# Patient Record
Sex: Male | Born: 2000 | Race: Black or African American | Hispanic: No | Marital: Single | Smoking: Never smoker
Health system: Southern US, Community
[De-identification: ages and names within clinical notes are randomized; demographics above are authoritative.]

## PROBLEM LIST (undated history)

## (undated) DIAGNOSIS — F32A Depression, unspecified: Secondary | ICD-10-CM

## (undated) DIAGNOSIS — F419 Anxiety disorder, unspecified: Secondary | ICD-10-CM

## (undated) DIAGNOSIS — J45909 Unspecified asthma, uncomplicated: Secondary | ICD-10-CM

## (undated) DIAGNOSIS — B009 Herpesviral infection, unspecified: Secondary | ICD-10-CM

## (undated) HISTORY — DX: Unspecified asthma, uncomplicated: J45.909

## (undated) HISTORY — DX: Depression, unspecified: F32.A

## (undated) HISTORY — DX: Anxiety disorder, unspecified: F41.9

---
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
## 2018-07-03 ENCOUNTER — Encounter: Payer: Self-pay | Admitting: Emergency Medicine

## 2018-07-03 ENCOUNTER — Other Ambulatory Visit: Payer: Self-pay

## 2018-07-03 DIAGNOSIS — J029 Acute pharyngitis, unspecified: Secondary | ICD-10-CM | POA: Diagnosis present

## 2018-07-03 DIAGNOSIS — R509 Fever, unspecified: Secondary | ICD-10-CM | POA: Insufficient documentation

## 2018-07-03 DIAGNOSIS — J020 Streptococcal pharyngitis: Secondary | ICD-10-CM | POA: Diagnosis not present

## 2018-07-03 DIAGNOSIS — Z5321 Procedure and treatment not carried out due to patient leaving prior to being seen by health care provider: Secondary | ICD-10-CM | POA: Insufficient documentation

## 2018-07-03 NOTE — ED Triage Notes (Signed)
Pt works for [employer] and was instructed to come to ED due to pt having sore throat and fever since Wednesday. Pt denies cough.

## 2018-07-04 ENCOUNTER — Emergency Department
Admission: EM | Admit: 2018-07-04 | Discharge: 2018-07-04 | Disposition: A | Discharge status: Home / Self Care | Payer: Medicaid Other | Attending: Emergency Medicine | Admitting: Emergency Medicine

## 2018-07-04 ENCOUNTER — Emergency Department
Admission: EM | Admit: 2018-07-04 | Discharge: 2018-07-04 | Disposition: A | Discharge status: Home / Self Care | Payer: Medicaid Other | Source: Home / Self Care | Attending: Emergency Medicine | Admitting: Emergency Medicine

## 2018-07-04 ENCOUNTER — Other Ambulatory Visit: Payer: Self-pay

## 2018-07-04 ENCOUNTER — Emergency Department: Payer: Medicaid Other

## 2018-07-04 ENCOUNTER — Encounter: Payer: Self-pay | Admitting: Emergency Medicine

## 2018-07-04 DIAGNOSIS — J020 Streptococcal pharyngitis: Secondary | ICD-10-CM

## 2018-07-04 LAB — BASIC METABOLIC PANEL
Anion gap: 9 (ref 5–15)
BUN: 10 mg/dL (ref 6–20)
CO2: 26 mmol/L (ref 22–32)
Calcium: 9.6 mg/dL (ref 8.9–10.3)
Chloride: 101 mmol/L (ref 98–111)
Creatinine, Ser: 0.9 mg/dL (ref 0.61–1.24)
GFR calc Af Amer: >60 mL/min (ref >60)
GFR calc non Af Amer: >60 mL/min (ref >60)
Glucose, Bld: 98 mg/dL (ref 70–99)
Potassium: 3.8 mmol/L (ref 3.5–5.1)
Sodium: 136 mmol/L (ref 135–145)

## 2018-07-04 LAB — MONONUCLEOSIS SCREEN: Mono Screen: NEGATIVE

## 2018-07-04 LAB — CBC WITH DIFFERENTIAL (CANCER CENTER ONLY)
Abs Immature Granulocytes: 0.06 10*3/uL (ref 0.00–0.07)
Basophils Absolute: 0.1 10*3/uL (ref 0.0–0.1)
Basophils Relative: 0 %
Eosinophils Absolute: 0 10*3/uL (ref 0.0–0.5)
Eosinophils Relative: 0 %
HCT: 43.8 % (ref 39.0–52.0)
Hemoglobin: 14.3 g/dL (ref 13.0–17.0)
Immature Granulocytes: 0 %
Lymphocytes Relative: 11 %
Lymphs Abs: 1.8 10*3/uL (ref 0.7–4.0)
MCH: 28.4 pg (ref 26.0–34.0)
MCHC: 32.6 g/dL (ref 30.0–36.0)
MCV: 87.1 fL (ref 80.0–100.0)
Monocytes Absolute: 2 10*3/uL — ABNORMAL HIGH (ref 0.1–1.0)
Monocytes Relative: 12 %
Neutro Abs: 12.1 10*3/uL — ABNORMAL HIGH (ref 1.7–7.7)
Neutrophils Relative %: 77 %
Platelet Count: 174 10*3/uL (ref 150–400)
RBC: 5.03 MIL/uL (ref 4.22–5.81)
RDW: 12.2 % (ref 11.5–15.5)
WBC Count: 16 10*3/uL — ABNORMAL HIGH (ref 4.0–10.5)
nRBC: 0 % (ref 0.0–0.2)

## 2018-07-04 LAB — CBC
HCT: 43.4 % (ref 39.0–52.0)
Hemoglobin: 14.5 g/dL (ref 13.0–17.0)
MCH: 28.9 pg (ref 26.0–34.0)
MCHC: 33.4 g/dL (ref 30.0–36.0)
MCV: 86.5 fL (ref 80.0–100.0)
Platelets: 169 10*3/uL (ref 150–400)
RBC: 5.02 MIL/uL (ref 4.22–5.81)
RDW: 12 % (ref 11.5–15.5)
WBC: 16.2 10*3/uL — ABNORMAL HIGH (ref 4.0–10.5)
nRBC: 0 % (ref 0.0–0.2)

## 2018-07-04 LAB — GROUP A STREP BY PCR: Group A Strep by PCR: DETECTED — AB

## 2018-07-04 MED ORDER — PENICILLIN V POTASSIUM 250 MG PO TABS
500.0000 mg | ORAL_TABLET | Freq: Once | ORAL | Status: AC
  Administered 2018-07-04: 500 mg via ORAL
  Filled 2018-07-04: qty 2

## 2018-07-04 MED ORDER — PENICILLIN V POTASSIUM 125 MG/5ML PO SOLR: 500.0000 mg | Freq: Once | ORAL | Status: DC

## 2018-07-04 MED ORDER — PENICILLIN V POTASSIUM 500 MG PO TABS: 500.0000 mg | ORAL_TABLET | Freq: Four times a day (QID) | ORAL | 0 refills | Status: DC

## 2018-07-04 NOTE — ED Notes (Signed)
No answer when called several times from lobby 

## 2018-07-04 NOTE — ED Provider Notes (Signed)
[facility] Emergency Department Provider Note   ____________________________________________   First MD Initiated Contact with Patient 07/04/18 1513     (approximate)  I have reviewed the triage vital signs and the nursing notes.   HISTORY  Chief Complaint Weakness, Dizziness, and Sore Throat    HPI Luis Harper is a 18 y.o. male patient said since yesterday he has been feeling weak and dizzy having a sore throat a little bit of diffuse abdominal achiness.  He is also had a fever or felt hot.  He is not coughing or short of breath.  Sore throat is the worst part of the whole thing.  He has been using an over-the-counter phenol mouthwash without any success.         History reviewed. No pertinent past medical history. Patient denies any medical problems. There are no active problems to display for this patient.   History reviewed. No pertinent surgical history.  Prior to Admission medications   Medication Sig Start Date End Date Taking? Authorizing Provider  cyclobenzaprine (FLEXERIL) 5 MG tablet Take 1 tablet (5 mg total) by mouth 3 (three) times daily as needed for muscle spasms. Patient not taking: Reported on 07/04/2018 04/10/17   Menshew, Charlesetta IvoryJenise V Bacon, PA-C  ondansetron (ZOFRAN ODT) 4 MG disintegrating tablet Take 1 tablet (4 mg total) by mouth every 6 (six) hours as needed for nausea or vomiting. Patient not taking: Reported on 07/04/2018 10/31/14   Sharyn CreamerQuale, Mark, MD  penicillin v potassium (VEETID) 500 MG tablet Take 1 tablet (500 mg total) by mouth 4 (four) times daily. 07/04/18   Arnaldo NatalMalinda,  F, MD    Allergies Patient has no known allergies.  History reviewed. No pertinent family history.  Social History Social History   Tobacco Use  . Smoking status: Never Smoker  . Smokeless tobacco: Never Used  Substance Use Topics  . Alcohol use: No  . Drug use: No    Review of Systems  Constitutional:  fever/chills Eyes: No visual  changes. ENT:  sore throat. Cardiovascular: Denies chest pain. Respiratory: Denies shortness of breath. Gastrointestinal: Mild diffuse abdominal pain.  No nausea, no vomiting.  No diarrhea.  No constipation. Genitourinary: Negative for dysuria. Musculoskeletal: Negative for back pain. Skin: Negative for rash. Neurological: Negative for headaches, focal weakness   ____________________________________________   PHYSICAL EXAM:  VITAL SIGNS: ED Triage Vitals  Enc Vitals Group     BP 07/04/18 1327 (!) 137/103     Pulse Rate 07/04/18 1327 75     Resp 07/04/18 1327 14     Temp 07/04/18 1327 98.8 F (37.1 C)     Temp Source 07/04/18 1327 Oral     SpO2 07/04/18 1327 99 %     Weight 07/04/18 1328 220 lb (99.8 kg)     Height 07/04/18 1328 6\' 4"  (1.93 m)     Head Circumference --      Peak Flow --      Pain Score 07/04/18 1327 8     Pain Loc --      Pain Edu? --      Excl. in GC? --     Constitutional: Alert and oriented. Well appearing and in no acute distress. Eyes: Conjunctivae are normal. Head: Atraumatic. Nose: No congestion/rhinnorhea. Mouth/Throat: Mucous membranes are moist.  Oropharynx erythematous with white membrane on tonsils bilaterally Neck: No stridor. Hematological/Lymphatic/Immunilogical: Slight shotty anterior cervical lymphadenopathy. Cardiovascular: Normal rate, regular rhythm. Grossly normal heart sounds.  Good peripheral circulation. Respiratory: Normal  respiratory effort.  No retractions. Lungs CTAB. Gastrointestinal: Soft and nontender. No distention. No abdominal bruits. No CVA tenderness. Musculoskeletal: No lower extremity tenderness nor edema. . Neurologic:  Normal speech and language. No gross focal neurologic deficits are appreciated. Skin:  Skin is warm, dry and intact. No rash noted.  ____________________________________________   LABS (all labs ordered are listed, but only abnormal results are displayed)  Labs Reviewed  GROUP A STREP BY PCR  - Abnormal; Notable for the following components:      Result Value   Group A Strep by PCR DETECTED (*)    All other components within normal limits  CBC - Abnormal; Notable for the following components:   WBC 16.2 (*)    All other components within normal limits  CBC WITH DIFFERENTIAL ([facility] ONLY) - Abnormal; Notable for the following components:   WBC Count 16.0 (*)    Neutro Abs 12.1 (*)    Monocytes Absolute 2.0 (*)    All other components within normal limits  BASIC METABOLIC PANEL  MONONUCLEOSIS SCREEN   ____________________________________________  EKG  EKG read and interpreted by me shows normal sinus rhythm rate of 79 normal axis QRS durations 100 ms. ____________________________________________  RADIOLOGY  ED MD interpretation: Wrist x-ray read by radiology reviewed by me is normal  Official radiology report(s): Dg Chest Portable 1 View  Result Date: 07/04/2018 CLINICAL DATA:  Fever EXAM: PORTABLE CHEST 1 VIEW COMPARISON:  None. FINDINGS: Lungs are clear. Heart size and pulmonary vascularity are normal. No adenopathy. No bone lesions. IMPRESSION: No edema or consolidation. Electronically Signed   By: Lowella Grip III M.D.   On: 07/04/2018 15:39    ____________________________________________   PROCEDURES  Procedure(s) performed (including Critical Care):  Procedures   ____________________________________________   INITIAL IMPRESSION / ASSESSMENT AND PLAN / ED COURSE  [name] was evaluated in Emergency Department on 07/05/2018 for the symptoms described in the history of present illness. He was evaluated in the context of the global COVID-19 pandemic, which necessitated consideration that the patient might be at risk for infection with the SARS-CoV-2 virus that causes COVID-19. Institutional protocols and algorithms that pertain to the evaluation of patients at risk for COVID-19 are in a state of rapid change based on information released by  regulatory bodies including the CDC and federal and state organizations. These policies and algorithms were followed during the patient's care in the ED.             ____________________________________________   FINAL CLINICAL IMPRESSION(S) / ED DIAGNOSES  Final diagnoses:  Strep pharyngitis     ED Discharge Orders         Ordered    penicillin v potassium (VEETID) 500 MG tablet  4 times daily     07/04/18 1630           Note:  This document was prepared using Dragon voice recognition software and may include unintentional dictation errors.    Nena Polio, MD 07/05/18 [phone]

## 2018-07-04 NOTE — Discharge Instructions (Addendum)
I will give you penicillin 1 pill 4 times a day.  This will be a little bit of a higher dose but it should get rid of the strep quickly.  Today we will give you a dose in the ER and 1 more at home tomorrow take to 3 times a day.  Return for any further problems especially if you are getting worse or not getting any better in 2 or 3 days.

## 2018-07-04 NOTE — ED Triage Notes (Signed)
Pt to ED from home c/o generalized weakness, dizziness, sore throat, and fevers at home for a couple days.  Denies cough, denies COVID testing or exposure to others being tested.  Presents A&Ox4, chest rise even and unlabored, ambulatory with steady gait, in NAD at this time.

## 2018-10-01 ENCOUNTER — Other Ambulatory Visit: Payer: Self-pay

## 2018-10-01 ENCOUNTER — Ambulatory Visit: Payer: Medicaid Other | Admitting: Family Medicine

## 2018-10-01 DIAGNOSIS — Z202 Contact with and (suspected) exposure to infections with a predominantly sexual mode of transmission: Secondary | ICD-10-CM

## 2018-10-01 DIAGNOSIS — Z113 Encounter for screening for infections with a predominantly sexual mode of transmission: Secondary | ICD-10-CM

## 2018-10-01 MED ORDER — METRONIDAZOLE 500 MG PO TABS: 2000.0000 mg | ORAL_TABLET | Freq: Once | ORAL | 0 refills | Status: AC

## 2018-10-01 NOTE — Progress Notes (Signed)
    STI clinic/screening visit  Subjective:  Luis Harper is a 18 y.o. male being seen today for an STI screening visit. The patient reports they do not have symptoms.  Patient has the following medical conditions:  There are no active problems to display for this patient.    Chief Complaint  Patient presents with  . SEXUALLY TRANSMITTED DISEASE    tx for contact to trich    HPI  Patient reports he is a contact to trich and wants a STD screen today.  See flowsheet for further details and programmatic requirements.    The following portions of the patient's history were reviewed and updated as appropriate: allergies, current medications, past medical history, past social history, past surgical history and problem list.  Objective:  There were no vitals filed for this visit.  Physical Exam  Client declined exam today.    Assessment and Plan:  Luis Harper is a 18 y.o. male presenting to the [facility] Department for STI screening  1. Screening examination for venereal disease - GC/Chlamydia Probe Amp([facility]) - HIV [facility] - Syphilis Serology, [facility]  2. Venereal disease contact Metronidazole 2 gm po once. Co no to have sex x 1 wk  Co to use condoms always  No follow-ups on file.  No future appointments.  Hassell Done, FNP

## 2018-10-03 NOTE — Progress Notes (Signed)
Provider orders completed. 

## 2018-10-05 LAB — GC/CHLAMYDIA PROBE AMP
Chlamydia trachomatis, NAA: NEGATIVE
Neisseria Gonorrhoeae by PCR: NEGATIVE

## 2019-04-21 ENCOUNTER — Encounter: Payer: Self-pay | Admitting: Emergency Medicine

## 2019-04-21 ENCOUNTER — Other Ambulatory Visit: Payer: Self-pay

## 2019-04-21 ENCOUNTER — Emergency Department
Admission: EM | Admit: 2019-04-21 | Discharge: 2019-04-21 | Disposition: A | Discharge status: Home / Self Care | Payer: Medicaid Other | Attending: Emergency Medicine | Admitting: Emergency Medicine

## 2019-04-21 DIAGNOSIS — R531 Weakness: Secondary | ICD-10-CM | POA: Diagnosis not present

## 2019-04-21 DIAGNOSIS — R5383 Other fatigue: Secondary | ICD-10-CM | POA: Diagnosis present

## 2019-04-21 LAB — COMPREHENSIVE METABOLIC PANEL
ALT: 26 U/L (ref 0–44)
AST: 32 U/L (ref 15–41)
Albumin: 3.9 g/dL (ref 3.5–5.0)
Alkaline Phosphatase: 33 U/L — ABNORMAL LOW (ref 38–126)
Anion gap: 5 (ref 5–15)
BUN: 11 mg/dL (ref 6–20)
CO2: 26 mmol/L (ref 22–32)
Calcium: 8.8 mg/dL — ABNORMAL LOW (ref 8.9–10.3)
Chloride: 107 mmol/L (ref 98–111)
Creatinine, Ser: 0.84 mg/dL (ref 0.61–1.24)
GFR calc Af Amer: >60 mL/min (ref >60)
GFR calc non Af Amer: >60 mL/min (ref >60)
Glucose, Bld: 93 mg/dL (ref 70–99)
Potassium: 3.8 mmol/L (ref 3.5–5.1)
Sodium: 138 mmol/L (ref 135–145)
Total Bilirubin: 1.2 mg/dL (ref 0.3–1.2)
Total Protein: 6.6 g/dL (ref 6.5–8.1)

## 2019-04-21 LAB — URINALYSIS, COMPLETE (UACMP) WITH MICROSCOPIC
Bacteria, UA: NONE SEEN
Bilirubin Urine: NEGATIVE
Glucose, UA: NEGATIVE mg/dL
Hgb urine dipstick: NEGATIVE
Ketones, ur: NEGATIVE mg/dL
Nitrite: NEGATIVE
Protein, ur: NEGATIVE mg/dL
Specific Gravity, Urine: 1.023 (ref 1.005–1.030)
pH: 7 (ref 5.0–8.0)

## 2019-04-21 LAB — CBC WITH DIFFERENTIAL/PLATELET
Abs Immature Granulocytes: 0.01 10*3/uL (ref 0.00–0.07)
Basophils Absolute: 0 10*3/uL (ref 0.0–0.1)
Basophils Relative: 1 %
Eosinophils Absolute: 0.1 10*3/uL (ref 0.0–0.5)
Eosinophils Relative: 3 %
HCT: 38.4 % — ABNORMAL LOW (ref 39.0–52.0)
Hemoglobin: 12.5 g/dL — ABNORMAL LOW (ref 13.0–17.0)
Immature Granulocytes: 0 %
Lymphocytes Relative: 30 %
Lymphs Abs: 1.3 10*3/uL (ref 0.7–4.0)
MCH: 29.5 pg (ref 26.0–34.0)
MCHC: 32.6 g/dL (ref 30.0–36.0)
MCV: 90.6 fL (ref 80.0–100.0)
Monocytes Absolute: 0.5 10*3/uL (ref 0.1–1.0)
Monocytes Relative: 11 %
Neutro Abs: 2.4 10*3/uL (ref 1.7–7.7)
Neutrophils Relative %: 55 %
Platelets: 209 10*3/uL (ref 150–400)
RBC: 4.24 MIL/uL (ref 4.22–5.81)
RDW: 13.4 % (ref 11.5–15.5)
WBC: 4.3 10*3/uL (ref 4.0–10.5)
nRBC: 0 % (ref 0.0–0.2)

## 2019-04-21 NOTE — Discharge Instructions (Signed)
Your exam and labs are normal. You may be experiencing continue fatigue from you recent COVID infection. Continue to eat and drink to prevent dehydration. Select and follow-up for routine medical care. Return as needed.

## 2019-04-21 NOTE — ED Notes (Signed)
See triage note  Presents with generalized weakness  States he is not able to sleep  Just doesn't feel right   Afebrile

## 2019-04-21 NOTE — ED Triage Notes (Signed)
C/O fatigue.  C/O not sleeping well x 1 month.  States he has been unable to work.  Had COVID in March, and has not felt 'right' since that time.  Patient is AAOx3.  Skin warm and dry. NAD.

## 2019-04-21 NOTE — ED Provider Notes (Signed)
[facility] Emergency Department Provider Note ____________________________________________  Time seen: 1537  I have reviewed the triage vital signs and the nursing notes.  HISTORY  Chief Complaint  Insomnia and Fatigue  HPI Luis Harper is a 19 y.o. male Who presents himself to the ED for evaluation of a 1 month complaint of poor sleep.  He also reports generalized fatigue.  Patient was diagnosed with Covid in March and reports he is not felt quite "right" since that time.  Patient reports a close exposure to Covid in the middle of March, and he subsequently tested [facility] at [facility].  He was out of work for the requisite 2 weeks, but did not return to work secondary to his desire to transition to a first shift rotation.  Patient presents today for evaluation of his ongoing intermittent fatigue.  He denies any fevers, chills, or sweats.  He also denies any chest pain, shortness of breath, nausea, vomiting, or diarrhea.  Patient takes no daily medications and has no significant medical history.   History reviewed. No pertinent past medical history.  There are no problems to display for this patient.  History reviewed. No pertinent surgical history.  Prior to Admission medications   Not on File    Allergies Patient has no known allergies.  No family history on file.  Social History  Social History   Tobacco Use  . Smoking status: Never Smoker  . Smokeless tobacco: Never Used  Substance Use Topics  . Alcohol use: No  . Drug use: No    Review of Systems  Constitutional: Negative for fever. Eyes: Negative for visual changes. ENT: Negative for sore throat. Cardiovascular: Negative for chest pain. Respiratory: Negative for shortness of breath. Gastrointestinal: Negative for abdominal pain, vomiting and diarrhea. Genitourinary: Negative for dysuria. Musculoskeletal: Negative for back pain. Skin: Negative for rash. Neurological: Negative for  headaches, focal weakness or numbness. ____________________________________________  PHYSICAL EXAM:  VITAL SIGNS: ED Triage Vitals  Enc Vitals Group     BP 04/21/19 1450 140/79     Pulse Rate 04/21/19 1450 77     Resp 04/21/19 1450 16     Temp 04/21/19 1450 98.5 F (36.9 C)     Temp Source 04/21/19 1450 Oral     SpO2 04/21/19 1450 99 %     Weight 04/21/19 1448 220 lb 0.3 oz (99.8 kg)     Height 04/21/19 1448 6\' 4"  (1.93 m)     Head Circumference --      Peak Flow --      Pain Score 04/21/19 1447 6     Pain Loc --      Pain Edu? --      Excl. in [place]? --     Constitutional: Alert and oriented. Well appearing and in no distress. Head: Normocephalic and atraumatic. Eyes: Conjunctivae are normal. Normal extraocular movements Cardiovascular: Normal rate, regular rhythm. Normal distal pulses. Respiratory: Normal respiratory effort. No wheezes/rales/rhonchi. Gastrointestinal: Soft and nontender. No distention. Musculoskeletal: Nontender with normal range of motion in all extremities.  Neurologic:  Normal gait without ataxia. Normal speech and language. No gross focal neurologic deficits are appreciated. Skin:  Skin is warm, dry and intact. No rash noted. Psychiatric: Mood and affect are normal. Patient exhibits appropriate insight and judgment. ____________________________________________   LABS (pertinent positives/negatives) Labs Reviewed  CBC WITH DIFFERENTIAL/PLATELET - Abnormal; Notable for the following components:      Result Value   Hemoglobin 12.5 (*)    HCT  38.4 (*)    All other components within normal limits  COMPREHENSIVE METABOLIC PANEL - Abnormal; Notable for the following components:   Calcium 8.8 (*)    Alkaline Phosphatase 33 (*)    All other components within normal limits  URINALYSIS, COMPLETE (UACMP) WITH MICROSCOPIC - Abnormal; Notable for the following components:   Color, Urine YELLOW (*)    APPearance CLEAR (*)    Leukocytes,Ua TRACE (*)    All  other components within normal limits  ____________________________________________  PROCEDURES  Procedures ____________________________________________  INITIAL IMPRESSION / ASSESSMENT AND PLAN / ED COURSE  Patient with ED evaluation and concern over intermittent fatigue since his Covid diagnosis 4 weeks prior.  Patient with only generalized complaints of fatigue and malaise.  He currently is employed by [employer], as a third shift work.  His desire is to transition to a first shift schedule.  He does not report return to full duty at his job, and notes that they are requesting him to be evaluated prior to returning to work.  Voices no desire to return that particular job and/or shift.  I informed the patient that his labs and exam overall benign reassuring at this time.  The symptoms may represent a persistent symptom of his Covid diagnosis.  Otherwise no signs of acute respiratory distress, dehydration, or toxic appearance.  Patient is referred to a local community clinic for ongoing and routine medical care.  He is return to work on Monday as requested, and is also advised to consider notice of resignation to his employer.  Luis Harper was evaluated in Emergency Department on 04/21/2019 for the symptoms described in the history of present illness. He was evaluated in the context of the global COVID-19 pandemic, which necessitated consideration that the patient might be at risk for infection with the SARS-CoV-2 virus that causes COVID-19. Institutional protocols and algorithms that pertain to the evaluation of patients at risk for COVID-19 are in a state of rapid change based on information released by regulatory bodies including the CDC and federal and state organizations. These policies and algorithms were followed during the patient's care in the ED.  ____________________________________________  FINAL CLINICAL IMPRESSION(S) / ED DIAGNOSES  Final diagnoses:  Generalized weakness      Karmen Stabs,  Charlesetta Ivory, PA-C 04/21/19 1740    Dionne Bucy, MD 04/21/19 2328

## 2019-05-06 ENCOUNTER — Encounter: Payer: Self-pay | Admitting: Family Medicine

## 2019-05-06 ENCOUNTER — Other Ambulatory Visit: Payer: Self-pay

## 2019-05-06 ENCOUNTER — Ambulatory Visit: Payer: Medicaid Other | Admitting: Family Medicine

## 2019-05-06 DIAGNOSIS — F419 Anxiety disorder, unspecified: Secondary | ICD-10-CM

## 2019-05-06 DIAGNOSIS — B009 Herpesviral infection, unspecified: Secondary | ICD-10-CM | POA: Diagnosis not present

## 2019-05-06 DIAGNOSIS — F329 Major depressive disorder, single episode, unspecified: Secondary | ICD-10-CM

## 2019-05-06 DIAGNOSIS — Z113 Encounter for screening for infections with a predominantly sexual mode of transmission: Secondary | ICD-10-CM | POA: Diagnosis not present

## 2019-05-06 LAB — GRAM STAIN

## 2019-05-06 MED ORDER — ACYCLOVIR 400 MG PO TABS: 400.0000 mg | ORAL_TABLET | Freq: Three times a day (TID) | ORAL | 0 refills | Status: AC

## 2019-05-06 NOTE — Progress Notes (Signed)
Wet mount reviewed and no treatment indicated per standing order. Herpes pamphlet and Acyclovir given per provider order. Jossie Ng, RN

## 2019-05-06 NOTE — Progress Notes (Signed)
[facility] Department STI clinic/screening visit  Subjective:  Luis Harper is a 19 y.o. male being seen today for  Chief Complaint  Patient presents with  . SEXUALLY TRANSMITTED DISEASE     The patient reports they do have symptoms.   Patient has the following medical conditions:  There are no problems to display for this patient.   HPI  Pt reports he had some tender bumps on penis and swollen lymph node in groin, began 4 days ago. Denies systemic illness. No known STI contacts.   See flowsheet for further details and programmatic requirements.    No components found for: HCV  The following portions of the patient's history were reviewed and updated as appropriate: allergies, current medications, past medical history, past social history, past surgical history and problem list.  Objective:  There were no vitals filed for this visit.   Physical Exam Constitutional:      Appearance: Normal appearance.  HENT:     Head: Normocephalic and atraumatic.     Comments: No nits or hair loss    Mouth/Throat:     Mouth: Mucous membranes are moist.     Pharynx: Oropharynx is clear. No oropharyngeal exudate or posterior oropharyngeal erythema.  Pulmonary:     Effort: Pulmonary effort is normal.  Abdominal:     General: Abdomen is flat.     Palpations: Abdomen is soft. There is no hepatomegaly or mass.     Tenderness: There is no abdominal tenderness.  Genitourinary:    Pubic Area: No rash or pubic lice.      Penis: Lesions (cluster of vesicular lesions on erythematous base on posterior shaft) present.      Testes: Normal.     Epididymis:     Right: Normal.     Left: Normal.     Rectum: Normal.  Lymphadenopathy:     Head:     Right side of head: No preauricular or posterior auricular adenopathy.     Left side of head: No preauricular or posterior auricular adenopathy.     Cervical: No cervical adenopathy.     Upper Body:     Right upper body: No supraclavicular  or axillary adenopathy.     Left upper body: No supraclavicular or axillary adenopathy.     Lower Body: Right inguinal adenopathy present. No left inguinal adenopathy.  Skin:    General: Skin is warm and dry.     Findings: No rash.  Neurological:     Mental Status: He is alert and oriented to person, place, and time.       Assessment and Plan:  Luis Detter is a 19 y.o. male presenting to the [facility] Department for STI screening   1. Screening examination for venereal disease -Pt with symptoms. Screenings today as below. Treat gram stain per standing order. -Patient does meet criteria for HepB, HepC Screening. Accepts these screenings. -Counseled on warning s/sx and when to seek care. Recommended condom use with all sex and discussed importance of condom use for STI prevention. - Gram stain - Gonococcus culture - HBV Antigen/[facility] - HIV/HCV [place] [facility] - Syphilis Serology, [facility]  2. HSV (herpes simplex virus) infection -Appearance of HSV on exam, will get culture and treat what is likely primary infection today as below.  -Pt was counseled regarding HSV disease, transmission, cyclic nature, subclinical disease and treatment options.  -Pt informed they will be called if culture is HSV + and option for  suppressive treatment will also be discussed further at that time. - Virology, [facility] - acyclovir (ZOVIRAX) 400 MG tablet; Take 1 tablet (400 mg total) by mouth 3 (three) times daily for 10 days.  Dispense: 30 tablet; Refill: 0  3. Anxiety and depression Pt endorses anxiety upon flowsheet questioning and would like to speak with behavioral health, will refer.  - Ambulatory referral to [facility]     Return if symptoms worsen or fail to improve.  No future appointments.  Kandee Keen, PA-C

## 2019-05-11 LAB — HEPATITIS B SURFACE ANTIGEN

## 2019-05-11 LAB — GONOCOCCUS CULTURE

## 2019-05-12 ENCOUNTER — Telehealth: Payer: Self-pay

## 2019-05-12 DIAGNOSIS — B009 Herpesviral infection, unspecified: Secondary | ICD-10-CM

## 2019-05-12 LAB — HM HEPATITIS C SCREENING LAB: HM Hepatitis Screen: NEGATIVE

## 2019-05-12 LAB — HM HIV SCREENING LAB: HM HIV Screening: NEGATIVE

## 2019-05-12 MED ORDER — ACYCLOVIR 800 MG PO TABS: 800.0000 mg | ORAL_TABLET | Freq: Every morning | ORAL | 12 refills | Status: AC

## 2019-05-12 NOTE — Telephone Encounter (Signed)
TC to patient. Verified ID via password/SS#. Informed of positive HSV 2. Discussed treatment option, disease process and transmission. Patient would like to proceed with daily prohy. Will [facility] in Rx to [facility] [address] For Acyclovir 800mg  1 po qd #31 refills x 1 year per [place] VO. , RN

## 2019-05-26 ENCOUNTER — Other Ambulatory Visit: Payer: Self-pay

## 2019-05-26 ENCOUNTER — Ambulatory Visit: Payer: Medicaid Other | Admitting: Physician Assistant

## 2019-05-26 ENCOUNTER — Encounter: Payer: Self-pay | Admitting: Physician Assistant

## 2019-05-26 DIAGNOSIS — Z09 Encounter for follow-up examination after completed treatment for conditions other than malignant neoplasm: Secondary | ICD-10-CM

## 2019-05-26 DIAGNOSIS — Z113 Encounter for screening for infections with a predominantly sexual mode of transmission: Secondary | ICD-10-CM | POA: Diagnosis not present

## 2019-05-26 DIAGNOSIS — Z7189 Other specified counseling: Secondary | ICD-10-CM

## 2019-05-26 NOTE — Progress Notes (Signed)
S:  Patient into clinic requesting to discuss test results from last IS visit.  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  States that he has completed the medicine that he was given at his last visit. O:  WDWN male in NAD, A&O x 3, pleasant and cooperative, normal work of breathing.  See results under results tab. A/P:  1.  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  Counseled extensively on type 1 vs type 2, dz, transmission, cyclic nature, subclinical dz, and treatment options. 2. Reviewed with patient that all other results are negative, that the Hep B test shows antibodies so this means that his vaccine series worked and will protect him from Hep B infection. 3.  Counseled that per chart Rx was sent to pharmacy on record for medicine with refills for 1 year and he can check to see if the pharmacy has that Rx and let us know if they do not have it.  4.  Rec condoms with all sex. 5.  RTC prn. Visit was face to face counseling for approximately 30 minutes.

## 2019-05-26 NOTE — Progress Notes (Signed)
Here to discuss lab results from 05/06/2019 OV. Declines all STD screening. Tawny Hopping, RN

## 2019-09-01 ENCOUNTER — Other Ambulatory Visit: Payer: Self-pay

## 2019-09-01 ENCOUNTER — Encounter: Payer: Self-pay | Admitting: Emergency Medicine

## 2019-09-01 DIAGNOSIS — R1012 Left upper quadrant pain: Secondary | ICD-10-CM | POA: Insufficient documentation

## 2019-09-01 DIAGNOSIS — R112 Nausea with vomiting, unspecified: Secondary | ICD-10-CM | POA: Diagnosis not present

## 2019-09-01 DIAGNOSIS — R197 Diarrhea, unspecified: Secondary | ICD-10-CM | POA: Insufficient documentation

## 2019-09-01 LAB — COMPREHENSIVE METABOLIC PANEL
ALT: 13 U/L (ref 0–44)
AST: 22 U/L (ref 15–41)
Albumin: 4.4 g/dL (ref 3.5–5.0)
Alkaline Phosphatase: 37 U/L — ABNORMAL LOW (ref 38–126)
Anion gap: 11 (ref 5–15)
BUN: 11 mg/dL (ref 6–20)
CO2: 26 mmol/L (ref 22–32)
Calcium: 9.4 mg/dL (ref 8.9–10.3)
Chloride: 102 mmol/L (ref 98–111)
Creatinine, Ser: 0.96 mg/dL (ref 0.61–1.24)
GFR calc Af Amer: >60 mL/min (ref >60)
GFR calc non Af Amer: >60 mL/min (ref >60)
Glucose, Bld: 88 mg/dL (ref 70–99)
Potassium: 3.6 mmol/L (ref 3.5–5.1)
Sodium: 139 mmol/L (ref 135–145)
Total Bilirubin: 1.6 mg/dL — ABNORMAL HIGH (ref 0.3–1.2)
Total Protein: 7.6 g/dL (ref 6.5–8.1)

## 2019-09-01 LAB — CBC WITH DIFFERENTIAL/PLATELET
Abs Immature Granulocytes: 0.01 10*3/uL (ref 0.00–0.07)
Basophils Absolute: 0.1 10*3/uL (ref 0.0–0.1)
Basophils Relative: 1 %
Eosinophils Absolute: 0.1 10*3/uL (ref 0.0–0.5)
Eosinophils Relative: 1 %
HCT: 43.4 % (ref 39.0–52.0)
Hemoglobin: 14.1 g/dL (ref 13.0–17.0)
Immature Granulocytes: 0 %
Lymphocytes Relative: 34 %
Lymphs Abs: 1.8 10*3/uL (ref 0.7–4.0)
MCH: 29.1 pg (ref 26.0–34.0)
MCHC: 32.5 g/dL (ref 30.0–36.0)
MCV: 89.7 fL (ref 80.0–100.0)
Monocytes Absolute: 0.6 10*3/uL (ref 0.1–1.0)
Monocytes Relative: 12 %
Neutro Abs: 2.9 10*3/uL (ref 1.7–7.7)
Neutrophils Relative %: 52 %
Platelets: 185 10*3/uL (ref 150–400)
RBC: 4.84 MIL/uL (ref 4.22–5.81)
RDW: 12.3 % (ref 11.5–15.5)
WBC: 5.5 10*3/uL (ref 4.0–10.5)
nRBC: 0 % (ref 0.0–0.2)

## 2019-09-01 LAB — URINALYSIS, COMPLETE (UACMP) WITH MICROSCOPIC
Bilirubin Urine: NEGATIVE
Glucose, UA: NEGATIVE mg/dL
Hgb urine dipstick: NEGATIVE
Ketones, ur: NEGATIVE mg/dL
Leukocytes,Ua: NEGATIVE
Nitrite: NEGATIVE
Protein, ur: NEGATIVE mg/dL
Specific Gravity, Urine: 1.027 (ref 1.005–1.030)
pH: 6 (ref 5.0–8.0)

## 2019-09-01 LAB — LIPASE, BLOOD: Lipase: 27 U/L (ref 11–51)

## 2019-09-01 NOTE — ED Triage Notes (Signed)
Patient ambulatory to triage with steady gait, without difficulty or distress noted; pt reports abd pain accomp by some N/V/D x 2 days

## 2019-09-02 ENCOUNTER — Emergency Department
Admission: EM | Admit: 2019-09-02 | Discharge: 2019-09-02 | Disposition: A | Discharge status: Home / Self Care | Payer: Medicaid Other | Attending: Emergency Medicine | Admitting: Emergency Medicine

## 2019-09-02 DIAGNOSIS — R1012 Left upper quadrant pain: Secondary | ICD-10-CM

## 2019-09-02 DIAGNOSIS — R197 Diarrhea, unspecified: Secondary | ICD-10-CM

## 2019-09-02 MED ORDER — ONDANSETRON HCL 4 MG PO TABS: 4.0000 mg | ORAL_TABLET | Freq: Every day | ORAL | 1 refills | Status: AC | PRN

## 2019-09-02 NOTE — ED Provider Notes (Signed)
[facility] Emergency Department Provider Note  ____________________________________________   First MD Initiated Contact with Patient 09/02/19 0109     (approximate)  I have reviewed the triage vital signs and the nursing notes.   HISTORY  Chief Complaint Abdominal Pain    HPI Luis Harper is a 19 y.o. male with no contributory clinical history who presents for evaluation of some nausea, vomiting, diarrhea, and abdominal pain for about 2 days.   He has had one episode of vomiting but more frequently has been nauseated.  He has only had 1 bowel movement a day but said the last one was a little bit loose.  The pain is an aching pain in the left upper part of his abdomen.  It is currently gone.  He has been eating and drinking a little bit less than usual.  He denies fever, sore throat, chest pain, shortness of breath, cough, dysuria.  He had no trauma.  The symptoms were relatively mild but they were worse today so he thought he should get it checked out but he feels fine currently.  Nothing in particular made his symptoms better or worse.        History reviewed. No pertinent past medical history.  Patient Active Problem List   Diagnosis Date Noted  . 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 No pertinent surgical history.  Prior to Admission medications   Not on File    Allergies Patient has no known allergies.  No family history on file.  Social History Social History   Tobacco Use  . Smoking status: Never Smoker  . Smokeless tobacco: Never Used  Vaping Use  . Vaping Use: Some days  Substance Use Topics  . Alcohol use: Yes    Comment: 2x/yr  . Drug use: Yes    Types: Marijuana    Comment: very infrequently    Review of Systems Constitutional: No fever/chills Eyes: No visual changes. ENT: No sore throat. Cardiovascular: Denies chest pain. Respiratory: Denies shortness of breath. Gastrointestinal: Left  upper quadrant abdominal pain, now resolved.  One episode of vomiting, some nausea, one episode of loose stool. Genitourinary: Negative for dysuria. Musculoskeletal: Negative for neck pain.  Negative for back pain. Integumentary: Negative for rash. Neurological: Negative for headaches, focal weakness or numbness.   ____________________________________________   PHYSICAL EXAM:  VITAL SIGNS: ED Triage Vitals  Enc Vitals Group     BP 09/01/19 2036 120/73     Pulse Rate 09/01/19 2036 75     Resp 09/01/19 2036 18     Temp 09/01/19 2036 98.7 F (37.1 C)     Temp Source 09/01/19 2036 Oral     SpO2 09/01/19 2036 98 %     Weight 09/01/19 2036 102 kg (224 lb 13.9 oz)     Height 09/01/19 2036 1.93 m (6\' 4" )     Head Circumference --      Peak Flow --      Pain Score 09/01/19 2045 5     Pain Loc --      Pain Edu? --      Excl. in [place]? --     Constitutional: Alert and oriented.  Patient is well-appearing and in no distress. Eyes: Conjunctivae are normal.  Head: Atraumatic. Nose: No congestion/rhinnorhea. Mouth/Throat: Patient is wearing a mask. Neck: No stridor.  No meningeal signs.   Cardiovascular: Normal rate, regular rhythm. Good peripheral circulation. Grossly normal heart sounds. Respiratory: Normal respiratory effort.  No retractions. Gastrointestinal: Thin and muscular body habitus.  Soft and nontender. No distention.  Musculoskeletal: No lower extremity tenderness nor edema. No gross deformities of extremities.  Ambulatory without difficulty. Neurologic:  Normal speech and language. No gross focal neurologic deficits are appreciated.  Skin:  Skin is warm, dry and intact. Psychiatric: Mood and affect are normal. Speech and behavior are normal.  ____________________________________________   LABS (all labs ordered are listed, but only abnormal results are displayed)  Labs Reviewed  COMPREHENSIVE METABOLIC PANEL - Abnormal; Notable for the following components:       Result Value   Alkaline Phosphatase 37 (*)    Total Bilirubin 1.6 (*)    All other components within normal limits  URINALYSIS, COMPLETE (UACMP) WITH MICROSCOPIC - Abnormal; Notable for the following components:   Color, Urine YELLOW (*)    APPearance HAZY (*)    Bacteria, UA RARE (*)    All other components within normal limits  CBC WITH DIFFERENTIAL/PLATELET  LIPASE, BLOOD   ____________________________________________  EKG  No indication for emergent EKG ____________________________________________  RADIOLOGY I, Loleta Rose, personally viewed and evaluated these images (plain radiographs) as part of my medical decision making, as well as reviewing the written report by the radiologist.  ED MD interpretation: No indication for emergent imaging  Official radiology report(s): No results found.  ____________________________________________   PROCEDURES   Procedure(s) performed (including Critical Care):  Procedures   ____________________________________________   INITIAL IMPRESSION / MDM / ASSESSMENT AND PLAN / ED COURSE  As part of my medical decision making, I reviewed the following data within the electronic MEDICAL RECORD NUMBER Nursing notes reviewed and incorporated, Labs reviewed , Old chart reviewed and Notes from prior ED visits   Differential diagnosis includes, but is not limited to, viral illness, electrolyte or metabolic abnormality, appendicitis, gallbladder disease, diverticulitis, epiploic appendagitis.  Patient has normal and stable vital signs.  He has been waiting in the emergency department for nearly 6 hours and is asymptomatic.  His lab work is normal including no leukocytosis, no evidence of bacterial infection on UA, normal lipase, and normal comprehensive metabolic panel other than a very slightly elevated total bilirubin at 1.6 which is likely unrelated to his current presentation.  He is afebrile.  He has absolutely no tenderness to palpation of  his abdomen.  I explained that I did not think he needed imaging and he was comfortable with that plan.  I will write him a prescription for Zofran and I gave my usual and customary return precautions and he agrees with the plan.           ____________________________________________  FINAL CLINICAL IMPRESSION(S) / ED DIAGNOSES  Final diagnoses:  None     MEDICATIONS GIVEN DURING THIS VISIT:  Medications - No data to display   ED Discharge Orders    None      *Please note:  Luis Harper was evaluated in Emergency Department on 09/02/2019 for the symptoms described in the history of present illness. He was evaluated in the context of the global COVID-19 pandemic, which necessitated consideration that the patient might be at risk for infection with the SARS-CoV-2 virus that causes COVID-19. Institutional protocols and algorithms that pertain to the evaluation of patients at risk for COVID-19 are in a state of rapid change based on information released by regulatory bodies including the CDC and federal and state organizations. These policies and algorithms were followed during the patient's care in the ED.  Some ED evaluations  and interventions may be delayed as a result of limited staffing during and after the pandemic.*  Note:  This document was prepared using Dragon voice recognition software and may include unintentional dictation errors.   Loleta Rose, MD 09/02/19 [phone]

## 2019-09-02 NOTE — Discharge Instructions (Signed)

## 2020-05-31 ENCOUNTER — Emergency Department
Admission: EM | Admit: 2020-05-31 | Discharge: 2020-05-31 | Disposition: A | Discharge status: Home / Self Care | Payer: Medicaid Other | Attending: Emergency Medicine | Admitting: Emergency Medicine

## 2020-05-31 ENCOUNTER — Other Ambulatory Visit: Payer: Self-pay

## 2020-05-31 DIAGNOSIS — Z20822 Contact with and (suspected) exposure to covid-19: Secondary | ICD-10-CM | POA: Diagnosis not present

## 2020-05-31 DIAGNOSIS — J069 Acute upper respiratory infection, unspecified: Secondary | ICD-10-CM | POA: Diagnosis not present

## 2020-05-31 DIAGNOSIS — R0981 Nasal congestion: Secondary | ICD-10-CM | POA: Diagnosis present

## 2020-05-31 DIAGNOSIS — J029 Acute pharyngitis, unspecified: Secondary | ICD-10-CM | POA: Diagnosis not present

## 2020-05-31 LAB — RESP PANEL BY RT-PCR (FLU A&B, COVID) ARPGX2
Influenza A by PCR: NEGATIVE
Influenza B by PCR: NEGATIVE
SARS Coronavirus 2 by RT PCR: NEGATIVE

## 2020-05-31 LAB — GROUP A STREP BY PCR: Group A Strep by PCR: NOT DETECTED

## 2020-05-31 NOTE — Discharge Instructions (Signed)
Please use Tylenol and ibuprofen as needed for your symptoms.  Return to the emergency department if you experience any worsening.  I will call you with your COVID and flu results.

## 2020-05-31 NOTE — ED Triage Notes (Signed)
Pt comes with c/o sore throat, cough and congestion for about 3 days. Pt states headache and achy all over. Pt states some vomiting.

## 2020-05-31 NOTE — ED Provider Notes (Signed)
[facility] Emergency Department Provider Note  ____________________________________________   Event Date/Time   First MD Initiated Contact with Patient 05/31/20 1914     (approximate)  I have reviewed the triage vital signs and the nursing notes.   HISTORY  Chief Complaint Sore Throat   HPI Luis Harper is a 20 y.o. male who presents to the emergency department for evaluation of sore throat, cough, congestion for the last 3 days.  He also endorses headache, body aches, chills but denies known fevers.  He denies any known sick contacts, denies abdominal pain, nausea, vomiting, diarrhea.        History reviewed. No pertinent past medical history.  Patient Active Problem List   Diagnosis Date Noted  . 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 No pertinent surgical history.  Prior to Admission medications   Medication Sig Start Date End Date Taking? Authorizing Provider  ondansetron (ZOFRAN) 4 MG tablet Take 1 tablet (4 mg total) by mouth daily as needed for nausea or vomiting. 09/02/19 09/01/20  Loleta Rose, MD    Allergies Patient has no known allergies.  No family history on file.  Social History Social History   Tobacco Use  . Smoking status: Never Smoker  . Smokeless tobacco: Never Used  Vaping Use  . Vaping Use: Some days  Substance Use Topics  . Alcohol use: Yes    Comment: 2x/yr  . Drug use: Yes    Types: Marijuana    Comment: very infrequently    Review of Systems Constitutional: No fever/ +chills Eyes: No visual changes. ENT: + sore throat. Cardiovascular: Denies chest pain. Respiratory: + Cough, denies shortness of breath. Gastrointestinal: No abdominal pain.  No nausea, no vomiting.  No diarrhea.  No constipation. Genitourinary: Negative for dysuria. Musculoskeletal: Negative for back pain. Skin: Negative for rash. Neurological: Negative for headaches, focal weakness or  numbness.   ____________________________________________   PHYSICAL EXAM:  VITAL SIGNS: ED Triage Vitals  Enc Vitals Group     BP 05/31/20 2000 139/78     Pulse Rate 05/31/20 2000 67     Resp 05/31/20 2000 18     Temp 05/31/20 2000 98.8 F (37.1 C)     Temp Source 05/31/20 2000 Oral     SpO2 05/31/20 2000 99 %     Weight --      Height --      Head Circumference --      Peak Flow --      Pain Score 05/31/20 1753 7     Pain Loc --      Pain Edu? --      Excl. in GC? --    Constitutional: Alert and oriented. Well appearing and in no acute distress. Eyes: Conjunctivae are normal. PERRL. EOMI. Head: Atraumatic. Nose: No congestion/rhinnorhea. Mouth/Throat: Mucous membranes are moist.  Oropharynx erythematous with no tonsillar enlargement or exudate. Neck: No stridor.   Lymphatic: No cervical lymphadenopathy Cardiovascular: Normal rate, regular rhythm. Grossly normal heart sounds.  Good peripheral circulation. Respiratory: Normal respiratory effort.  No retractions. Lungs CTAB. Gastrointestinal: Soft and nontender. No distention. No abdominal bruits. No CVA tenderness. Musculoskeletal: No lower extremity tenderness nor edema.  No joint effusions. Neurologic:  Normal speech and language. No gross focal neurologic deficits are appreciated. No gait instability. Skin:  Skin is warm, dry and intact. No rash noted. Psychiatric: Mood and affect are normal. Speech and behavior are normal.  ____________________________________________   LABS (all labs  ordered are listed, but only abnormal results are displayed)  Labs Reviewed  GROUP A STREP BY PCR  RESP PANEL BY RT-PCR (FLU A&B, COVID) [id number]   ____________________________________________   INITIAL IMPRESSION / ASSESSMENT AND PLAN / ED COURSE  As part of my medical decision making, I reviewed the following data within the electronic MEDICAL RECORD NUMBER Nursing notes reviewed and incorporated, Labs reviewed and Notes from prior  ED visits        Patient is a 20 year old male who presents to the emergency department for evaluation of sore throat, cough, congestion for the last 3 days.  See HPI for further details.  In triage, patient has normal vital signs.  On physical exam, he has a erythematous oropharynx without any tonsillar enlargement or exudate.  No cervical lymphadenopathy.  Heart and lung sounds are within normal limits.  Exam and history most consistent with viral infection, however will obtain respiratory panel and strep swab.  These returned negative.  Discussed symptomatic treatment with the patient and return precautions.  He is amenable with plan, stable time for outpatient management.      ____________________________________________   FINAL CLINICAL IMPRESSION(S) / ED DIAGNOSES  Final diagnoses:  Pharyngitis, unspecified etiology  Viral URI     ED Discharge Orders    None      *Please note:  Luis Harper was evaluated in Emergency Department on 05/31/2020 for the symptoms described in the history of present illness. He was evaluated in the context of the global COVID-19 pandemic, which necessitated consideration that the patient might be at risk for infection with the SARS-CoV-2 virus that causes COVID-19. Institutional protocols and algorithms that pertain to the evaluation of patients at risk for COVID-19 are in a state of rapid change based on information released by regulatory bodies including the CDC and federal and state organizations. These policies and algorithms were followed during the patient's care in the ED.  Some ED evaluations and interventions may be delayed as a result of limited staffing during and the pandemic.*   Note:  This document was prepared using Dragon voice recognition software and may include unintentional dictation errors.   Lucy Chris, PA 05/31/20 2337    Chesley Noon, MD 06/03/20 [phone]

## 2020-08-02 ENCOUNTER — Encounter: Payer: Self-pay | Admitting: Intensive Care

## 2020-08-02 ENCOUNTER — Other Ambulatory Visit: Payer: Self-pay

## 2020-08-02 ENCOUNTER — Emergency Department
Admission: EM | Admit: 2020-08-02 | Discharge: 2020-08-02 | Disposition: A | Discharge status: Home / Self Care | Payer: Medicaid Other | Attending: Emergency Medicine | Admitting: Emergency Medicine

## 2020-08-02 DIAGNOSIS — Y900 Blood alcohol level of less than 20 mg/100 ml: Secondary | ICD-10-CM | POA: Diagnosis not present

## 2020-08-02 DIAGNOSIS — F341 Dysthymic disorder: Secondary | ICD-10-CM

## 2020-08-02 DIAGNOSIS — Z79899 Other long term (current) drug therapy: Secondary | ICD-10-CM | POA: Insufficient documentation

## 2020-08-02 DIAGNOSIS — Z20822 Contact with and (suspected) exposure to covid-19: Secondary | ICD-10-CM | POA: Insufficient documentation

## 2020-08-02 DIAGNOSIS — R454 Irritability and anger: Secondary | ICD-10-CM

## 2020-08-02 DIAGNOSIS — F4325 Adjustment disorder with mixed disturbance of emotions and conduct: Secondary | ICD-10-CM

## 2020-08-02 DIAGNOSIS — F22 Delusional disorders: Secondary | ICD-10-CM | POA: Diagnosis not present

## 2020-08-02 LAB — CBC
HCT: 42 % (ref 39.0–52.0)
Hemoglobin: 13.9 g/dL (ref 13.0–17.0)
MCH: 29.4 pg (ref 26.0–34.0)
MCHC: 33.1 g/dL (ref 30.0–36.0)
MCV: 88.8 fL (ref 80.0–100.0)
Platelets: 214 10*3/uL (ref 150–400)
RBC: 4.73 MIL/uL (ref 4.22–5.81)
RDW: 12.7 % (ref 11.5–15.5)
WBC: 5.4 10*3/uL (ref 4.0–10.5)
nRBC: 0 % (ref 0.0–0.2)

## 2020-08-02 LAB — COMPREHENSIVE METABOLIC PANEL
ALT: 33 U/L (ref 0–44)
AST: 30 U/L (ref 15–41)
Albumin: 4.7 g/dL (ref 3.5–5.0)
Alkaline Phosphatase: 43 U/L (ref 38–126)
Anion gap: 7 (ref 5–15)
BUN: 18 mg/dL (ref 6–20)
CO2: 28 mmol/L (ref 22–32)
Calcium: 9.6 mg/dL (ref 8.9–10.3)
Chloride: 102 mmol/L (ref 98–111)
Creatinine, Ser: 0.84 mg/dL (ref 0.61–1.24)
GFR, Estimated: >60 mL/min (ref >60)
Glucose, Bld: 94 mg/dL (ref 70–99)
Potassium: 4.1 mmol/L (ref 3.5–5.1)
Sodium: 137 mmol/L (ref 135–145)
Total Bilirubin: 1.1 mg/dL (ref 0.3–1.2)
Total Protein: 7.9 g/dL (ref 6.5–8.1)

## 2020-08-02 LAB — URINE DRUG SCREEN, QUALITATIVE (ARMC ONLY)
Amphetamines, Ur Screen: NOT DETECTED
Barbiturates, Ur Screen: NOT DETECTED
Benzodiazepine, Ur Scrn: NOT DETECTED
Cannabinoid 50 Ng, Ur ~~LOC~~: POSITIVE — AB
Cocaine Metabolite,Ur ~~LOC~~: NOT DETECTED
MDMA (Ecstasy)Ur Screen: NOT DETECTED
Methadone Scn, Ur: NOT DETECTED
Opiate, Ur Screen: NOT DETECTED
Phencyclidine (PCP) Ur S: NOT DETECTED
Tricyclic, Ur Screen: NOT DETECTED

## 2020-08-02 LAB — SALICYLATE LEVEL: Salicylate Lvl: <7 mg/dL — ABNORMAL LOW (ref 7.0–30.0)

## 2020-08-02 LAB — RESP PANEL BY RT-PCR (FLU A&B, COVID) ARPGX2
Influenza A by PCR: NEGATIVE
Influenza B by PCR: NEGATIVE
SARS Coronavirus 2 by RT PCR: NEGATIVE

## 2020-08-02 LAB — ETHANOL: Alcohol, Ethyl (B): <10 mg/dL (ref <10)

## 2020-08-02 LAB — ACETAMINOPHEN LEVEL: Acetaminophen (Tylenol), Serum: <10 ug/mL — ABNORMAL LOW (ref 10–30)

## 2020-08-02 NOTE — ED Provider Notes (Signed)
[facility] Emergency Department Provider Note  Time seen: 5:24 PM  I have reviewed the triage vital signs and the nursing notes.   HISTORY  Chief Complaint Mental Health Problem   HPI Luis Harper is a 20 y.o. male with no significant past medical history presents to the emergency department for psychiatric evaluation.  According to the patient over the past year or 2 he has been experiencing significant anger issues.  Patient states he will easily get upset.  Patient does admit to occasional alcohol and marijuana use as he finds that these are the only things that help him calm down.  Patient denies any medical complaints today.  Patient does state his mother has a history of schizophrenia and at times he "sees things" that he believes are real that add to his anger, but is never been diagnosed with any psychiatric conditions nor has ever seen a psychiatrist.   History reviewed. No pertinent past medical history.  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 No pertinent surgical history.  Prior to Admission medications   Medication Sig Start Date End Date Taking? Authorizing Provider  ondansetron (ZOFRAN) 4 MG tablet Take 1 tablet (4 mg total) by mouth daily as needed for nausea or vomiting. 09/02/19 09/01/20  Loleta Rose, MD    No Known Allergies  History reviewed. No pertinent family history.  Social History Social History   Tobacco Use   Smoking status: Never   Smokeless tobacco: Never  Vaping Use   Vaping Use: Every day  Substance Use Topics   Alcohol use: Yes    Comment: occ   Drug use: Yes    Types: Marijuana    Comment: very infrequently    Review of Systems Constitutional: Negative for fever. Cardiovascular: Negative for chest pain. Respiratory: Negative for shortness of breath. Gastrointestinal: Negative for abdominal pain Musculoskeletal: Negative for  musculoskeletal complaints Neurological: Negative for headache All other ROS negative  ____________________________________________   PHYSICAL EXAM:  VITAL SIGNS: ED Triage Vitals  Enc Vitals Group     BP 08/02/20 1645 (!) 143/107     Pulse Rate 08/02/20 1645 73     Resp 08/02/20 1645 18     Temp 08/02/20 1645 98.8 F (37.1 C)     Temp src --      SpO2 08/02/20 1645 100 %     Weight 08/02/20 1650 215 lb (97.5 kg)     Height 08/02/20 1650 6\' 5"  (1.956 m)     Head Circumference --      Peak Flow --      Pain Score 08/02/20 1650 0     Pain Loc --      Pain Edu? --      Excl. in GC? --    Constitutional: Alert and oriented. Well appearing and in no distress. Eyes: Normal exam ENT      Head: Normocephalic and atraumatic.      Mouth/Throat: Mucous membranes are moist. Cardiovascular: Normal rate, regular rhythm.  Respiratory: Normal respiratory effort without tachypnea nor retractions. Breath sounds are clear  Gastrointestinal: Soft and nontender. No distention.  Musculoskeletal: Nontender with normal range of motion in all extremities.  Neurologic:  Normal speech and language. No gross focal neurologic deficits  Skin:  Skin is warm, dry and intact.  Psychiatric: No SI/HI  ____________________________________________   INITIAL IMPRESSION / ASSESSMENT AND PLAN / ED COURSE  Pertinent  labs & imaging results that were available during my care of the patient were reviewed by me and considered in my medical decision making (see chart for details).   Patient presents emergency department for anger issues.  No medical complaints today.  Reassuring physical exam and vitals.  We will check labs, consult psychiatry to evaluate.  Patient has no SI or HI does not meet IVC criteria at this time but wishes to stay voluntarily.  Medical work-up is been largely nonrevealing.  Patient has been seen and evaluated by psychiatry.  They believe the patient is safe for discharge home with  outpatient psychiatric follow-up.  Patient agreeable to plan of care.  Luis Legler was evaluated in Emergency Department on 08/02/2020 for the symptoms described in the history of present illness. He was evaluated in the context of the global COVID-19 pandemic, which necessitated consideration that the patient might be at risk for infection with the SARS-CoV-2 virus that causes COVID-19. Institutional protocols and algorithms that pertain to the evaluation of patients at risk for COVID-19 are in a state of rapid change based on information released by regulatory bodies including the CDC and federal and state organizations. These policies and algorithms were followed during the patient's care in the ED.  The patient has been placed in psychiatric observation due to the need to provide a safe environment for the patient while obtaining psychiatric consultation and evaluation, as well as ongoing medical and medication management to treat the patient's condition.  The patient has not been placed under full IVC at this time.  ____________________________________________   FINAL CLINICAL IMPRESSION(S) / ED DIAGNOSES  Anger issues   Minna Antis, MD 08/02/20 [phone]

## 2020-08-02 NOTE — ED Notes (Signed)
Patient verbalized concerns about not being helped the way he wanted to be helped today. After discussing that with the psychiatrist he stated to tell patient to follow up and they would proceed with the help and medication he needs. Patient was responsive with understanding and more calm.

## 2020-08-02 NOTE — BH Assessment (Signed)
Comprehensive Clinical Assessment (CCA) Note  08/02/2020 Luis Schaper [id number]  Luis Harper, 20 year old male who presents to [facility] ED voluntarily for treatment. Per triage note, Patient presents with feelings of "anger and tired of life." Denies SI or HI but admits to auditory hallucinations. Patient reports his mom has hx of schizophrenia and bipolar.   During TTS assessment pt presents alert and oriented x 4, anxious but cooperative, and mood-congruent with affect. The pt does not appear to be responding to internal or external stimuli. Neither is the pt presenting with any delusional thinking. Pt verified the information provided to triage RN.   Pt identifies his main complaint to be that he has anger issues of rage. Patient reports having thoughts of his past relationships are triggering and cause him to lash out. Patient reports he does not hurt others or himself but will hit the top of his car or break items at home. Patient reports meditation and speaking to someone with a soft voice usually calms him down. Patient reports using marijuana occasionally to relax him but he has since tried to cut back. Patient reports he is trying to take better care of his body and does not want to use harmful substances. Patient also mentioned having auditory and visual hallucinations that came about in 2018 after a bad breakup with his girlfriend. Patient reports he does not take any psych medications and denies any current INPT or OUPT hx. Pt reports family hx of MH with his mom being diagnosed with schizophrenia. Patient reports being employed but recently had an outburst at work that caused him to be sent home. Patient states he is planning to look for another job so he can work during the day versus at night. " That place was toxic and causing me stress." Patient reports people are envious of him which causes problems. Pt reports no current medical hx. Pt denies SI/HI. Pt is able to contract for safety and  believes outpatient treatment would be beneficial. " I don't want to be trapped in a space with crazy people walking around."   Per Dr. Toni Amend pt does not meet criteria for inpatient psychiatric admission.    Chief Complaint:  Chief Complaint  Patient presents with   Mental Health Problem   Visit Diagnosis: Adjustment disorder with mixed disturbance of emotions and conduct    CCA Screening, Triage and Referral (STR)  Patient Reported Information How did you hear about [place]? Self  Referral name: No data recorded Referral phone number: No data recorded  Whom do you see for routine medical problems? No data recorded Practice/Facility Name: No data recorded Practice/Facility Phone Number: No data recorded Name of Contact: No data recorded Contact Number: No data recorded Contact Fax Number: No data recorded Prescriber Name: No data recorded Prescriber Address (if known): No data recorded  What Is the Reason for Your Visit/Call Today? Patient reports coming to ED because of anger issues. Patient states thoughts of his past are triggering.  How Long Has This Been Causing You Problems? > than 6 months  What Do You Feel Would Help You the Most Today? -- (Assessment only)   Have You Recently Been in Any Inpatient Treatment (Hospital/Detox/Crisis Center/28-Day Program)? No data recorded Name/Location of Program/Hospital:No data recorded How Long Were You There? No data recorded When Were You Discharged? No data recorded  Have You Ever Received Services From [facility] Before? No data recorded Who Do You See at [facility]? No data recorded  Have You  Recently Had Any Thoughts About Hurting Yourself? No  Are You Planning to Commit Suicide/Harm Yourself At This time? No   Have you Recently Had Thoughts About Hurting Someone Karolee Ohs? No  Explanation: No data recorded  Have You Used Any Alcohol or Drugs in the Past 24 Hours? No  How Long Ago Did You Use Drugs or Alcohol? No data  recorded What Did You Use and How Much? No data recorded  Do You Currently Have a Therapist/Psychiatrist? No  Name of Therapist/Psychiatrist: No data recorded  Have You Been Recently Discharged From Any Office Practice or Programs? No  Explanation of Discharge From Practice/Program: No data recorded    CCA Screening Triage Referral Assessment Type of Contact: Face-to-Face  Is this Initial or Reassessment? No data recorded Date Telepsych consult ordered in [facility]:  No data recorded Time Telepsych consult ordered in [facility]:  No data recorded  Patient Reported Information Reviewed? No data recorded Patient Left Without Being Seen? No data recorded Reason for Not Completing Assessment: No data recorded  Collateral Involvement: None provided   Does Patient Have a Court Appointed Legal Guardian? No data recorded Name and Contact of Legal Guardian: No data recorded If Minor and Not Living with Parent(s), Who has Custody? n/a  Is CPS involved or ever been involved? Never  Is APS involved or ever been involved? Never   Patient Determined To Be At Risk for Harm To Self or Others Based on Review of Patient Reported Information or Presenting Complaint? No  Method: No data recorded Availability of Means: No data recorded Intent: No data recorded Notification Required: No data recorded Additional Information for Danger to Others Potential: No data recorded Additional Comments for Danger to Others Potential: No data recorded Are There Guns or Other Weapons in Your Home? No data recorded Types of Guns/Weapons: No data recorded Are These Weapons Safely Secured?                            No data recorded Who Could Verify You Are Able To Have These Secured: No data recorded Do You Have any Outstanding Charges, Pending Court Dates, Parole/Probation? No data recorded Contacted To Inform of Risk of Harm To Self or Others: No data recorded  Location of Assessment: [facility] ED   Does Patient  Present under Involuntary Commitment? 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

## 2020-08-02 NOTE — ED Triage Notes (Addendum)
Patient presents with feelings of "anger and tired of life." Denies SI or HI but admits to auditory hallucinations. Patient reports his mom has hx of schizophrenia and bipolar.

## 2020-08-02 NOTE — ED Notes (Signed)
Patient stated that he has been going through periods of anger and he cannot control it. He stated that when these happen he has visual hallucinations of people. Patient denies SI/HI but wants to be here to figure out how to control or treat these periods of anger.

## 2020-08-02 NOTE — Consult Note (Signed)
[facility] Face-to-Face Psychiatry Consult   Reason for Consult: Consult for 20 year old man who came voluntarily to the emergency room saying he needed help with anger Referring Physician: Paduchowski Patient Identification: Luis Harper MRN:  [id number] Principal Diagnosis: Adjustment disorder with mixed disturbance of emotions and conduct Diagnosis:  Principal Problem:   Adjustment disorder with mixed disturbance of emotions and conduct Active Problems:   Dysthymia   Total Time spent with patient: 1 hour  Subjective:   Luis Harper is a 20 y.o. male patient admitted with "I have problems with anger.  I get enraged".  HPI: Patient seen chart reviewed.  20 year old man came voluntarily to the emergency room.  Not clear if there was any specific reason for coming today although he did say that his stepmother told him that he should consider coming to the emergency room.  Patient says he has had problems with anger and emotional control that have been going on for years.  He estimates at least 4 years although he also goes back to tell stories about mood issues as far back as early childhood.  Patient says that he will frequently get angry.  He gets angry when he "thinks about the past".  A lot of the trauma he reports is somewhat vague and nonspecific.  Does not report any specific life-threatening trauma or sexual assault.  Instead talks about how he has been through a lot because several members of his family have died he has been estranged from much of his family especially his mother.  Also feels like he has had bad relationships with women.  He had said at one point that he was hearing things and seeing things.  When I tried to clarify that it sounds like mostly this is not really hallucinations but that he will overinterpret things he hears or people he sees and get paranoid ideas about them.  Frequently feels like people are talking about him or looking at him funny.  He absolutely denies ever getting  in any physical fights when he loses his temper.  At worst he will break things.  Gives the example of slamming a door very hard.  Denies any intention or thought of killing himself.  Patient has worked multiple jobs says he has moved around from one to the other because something will happen at a job that will get him upset and he will lose his temper and leave the job.  He did finish high school and says that in general he has lots of ambitious plans for the future but it is not clear that he is following up on any of them.  Does not drink very much.  Uses marijuana but says not daily and not very often denies other drug use.  Past Psychiatric History: Never seen a therapist or doctor in the past.  Never been in a psychiatric hospital.  No history of suicide attempts or serious violence.  No substance abuse treatment.  Risk to Self:   Risk to Others:   Prior Inpatient Therapy:   Prior Outpatient Therapy:    Past Medical History: History reviewed. No pertinent past medical history. History reviewed. No pertinent surgical history. Family History: History reviewed. No pertinent family history. Family Psychiatric  History: Mother is reported to have mental illness possibly schizophrenia or bipolar disorder.  Details are a little lacking. Social History:  Social History   Substance and Sexual Activity  Alcohol Use Yes   Comment: occ     Social History   Substance  and Sexual Activity  Drug Use Yes   Types: Marijuana   Comment: very infrequently    Social History   Socioeconomic History   Marital status: Single    Spouse name: Not on file   Number of children: Not on file   Years of education: Not on file   Highest education level: Not on file  Occupational History   Not on file  Tobacco Use   Smoking status: Never   Smokeless tobacco: Never  Vaping Use   Vaping Use: Every day  Substance and Sexual Activity   Alcohol use: Yes    Comment: occ   Drug use: Yes    Types: Marijuana     Comment: very infrequently   Sexual activity: Yes    Partners: Female    Birth control/protection: Condom  Other Topics Concern   Not on file  Social History Narrative   Not on file   Social Determinants of Health   Financial Resource Strain: Not on file  Food Insecurity: Not on file  Transportation Needs: Not on file  Physical Activity: Not on file  Stress: Not on file  Social Connections: Not on file   Additional Social History:    Allergies:  No Known Allergies  Labs:  Results for orders placed or performed during the hospital encounter of 08/02/20 (from the past 48 hour(s))  Comprehensive metabolic panel     Status: None   Collection Time: 08/02/20  4:55 PM  Result Value Ref Range   Sodium 137 135 - 145 mmol/L   Potassium 4.1 3.5 - 5.1 mmol/L   Chloride 102 98 - 111 mmol/L   CO2 28 22 - 32 mmol/L   Glucose, Bld 94 70 - 99 mg/dL    Comment: Glucose reference range applies only to samples taken after fasting for at least 8 hours.   BUN 18 6 - 20 mg/dL   Creatinine, Ser 0.01 0.61 - 1.24 mg/dL   Calcium 9.6 8.9 - 74.9 mg/dL   Total Protein 7.9 6.5 - 8.1 g/dL   Albumin 4.7 3.5 - 5.0 g/dL   AST 30 15 - 41 U/L   ALT 33 0 - 44 U/L   Alkaline Phosphatase 43 38 - 126 U/L   Total Bilirubin 1.1 0.3 - 1.2 mg/dL   GFR, Estimated >44 >96 mL/min    Comment: (NOTE) Calculated using the CKD-EPI Creatinine Equation (2021)    Anion gap 7 5 - 15    Comment: Performed at [facility], [address] [place], [place] [address]  Ethanol     Status: None   Collection Time: 08/02/20  4:55 PM  Result Value Ref Range   Alcohol, Ethyl (B) <10 <10 mg/dL    Comment: (NOTE) Lowest detectable limit for serum alcohol is 10 mg/dL.  For medical purposes only. Performed at [facility], [address] [place], [place] [address]   Salicylate level     Status: Abnormal   Collection Time: 08/02/20  4:55 PM  Result Value Ref Range   Salicylate Lvl <7.0 (L) 7.0 -  30.0 mg/dL    Comment: Performed at [facility], [address] [place], [place] [address]  Acetaminophen level     Status: Abnormal   Collection Time: 08/02/20  4:55 PM  Result Value Ref Range   Acetaminophen (Tylenol), Serum <10 (L) 10 - 30 ug/mL    Comment: (NOTE) Therapeutic concentrations vary significantly. A range of 10-30 ug/mL  may be an effective concentration  for many patients. However, some  are best treated at concentrations outside of this range. Acetaminophen concentrations >150 ug/mL at 4 hours after ingestion  and >50 ug/mL at 12 hours after ingestion are often associated with  toxic reactions.  Performed at [facility], [address] [place], [place] [address]   cbc     Status: None   Collection Time: 08/02/20  4:55 PM  Result Value Ref Range   WBC 5.4 4.0 - 10.5 K/uL   RBC 4.73 4.22 - 5.81 MIL/uL   Hemoglobin 13.9 13.0 - 17.0 g/dL   HCT 80.9 98.3 - 38.2 %   MCV 88.8 80.0 - 100.0 fL   MCH 29.4 26.0 - 34.0 pg   MCHC 33.1 30.0 - 36.0 g/dL   RDW 50.5 39.7 - 67.3 %   Platelets 214 150 - 400 K/uL   nRBC 0.0 0.0 - 0.2 %    Comment: Performed at [facility], [address] [place], [place] [address]  Urine Drug Screen, Qualitative     Status: Abnormal   Collection Time: 08/02/20  5:50 PM  Result Value Ref Range   Tricyclic, Ur Screen NONE DETECTED NONE DETECTED   Amphetamines, Ur Screen NONE DETECTED NONE DETECTED   MDMA (Ecstasy)Ur Screen NONE DETECTED NONE DETECTED   Cocaine Metabolite,Ur [place] NONE DETECTED NONE DETECTED   Opiate, Ur Screen NONE DETECTED NONE DETECTED   Phencyclidine (PCP) Ur S NONE DETECTED NONE DETECTED   Cannabinoid 50 Ng, Ur  POSITIVE (A) NONE DETECTED   Barbiturates, Ur Screen NONE DETECTED NONE DETECTED   Benzodiazepine, Ur Scrn NONE DETECTED NONE DETECTED   Methadone Scn, Ur NONE DETECTED NONE DETECTED    Comment: (NOTE) Tricyclics + metabolites, urine    Cutoff 1000 ng/mL Amphetamines +  metabolites, urine  Cutoff 1000 ng/mL MDMA (Ecstasy), urine              Cutoff 500 ng/mL Cocaine Metabolite, urine          Cutoff 300 ng/mL Opiate + metabolites, urine        Cutoff 300 ng/mL Phencyclidine (PCP), urine         Cutoff 25 ng/mL Cannabinoid, urine                 Cutoff 50 ng/mL Barbiturates + metabolites, urine  Cutoff 200 ng/mL Benzodiazepine, urine              Cutoff 200 ng/mL Methadone, urine                   Cutoff 300 ng/mL  The urine drug screen provides only a preliminary, unconfirmed analytical test result and should not be used for non-medical purposes. Clinical consideration and professional judgment should be applied to any positive drug screen result due to possible interfering substances. A more specific alternate chemical method must be used in order to obtain a confirmed analytical result. Gas chromatography / mass spectrometry (GC/MS) is the preferred confirm atory method. Performed at [facility], [address] [place], [place] [address]     No current facility-administered medications for this encounter.   Current Outpatient Medications  Medication Sig Dispense Refill   ondansetron (ZOFRAN) 4 MG tablet Take 1 tablet (4 mg total) by mouth daily as needed for nausea or vomiting. 30 tablet 1    Musculoskeletal: Strength & Muscle Tone: within normal limits Gait & Station: normal Patient leans: N/A  Psychiatric Specialty Exam:  Presentation  General Appearance:  No data recorded Eye Contact: No data recorded Speech: No data recorded Speech Volume: No data recorded Handedness: No data recorded  Mood and Affect  Mood: No data recorded Affect: No data recorded  Thought Process  Thought Processes: No data recorded Descriptions of Associations:No data recorded Orientation:No data recorded Thought Content:No data recorded History of Schizophrenia/Schizoaffective disorder:No data recorded Duration  of Psychotic Symptoms:No data recorded Hallucinations:No data recorded Ideas of Reference:No data recorded Suicidal Thoughts:No data recorded Homicidal Thoughts:No data recorded  Sensorium  Memory: No data recorded Judgment: No data recorded Insight: No data recorded  Executive Functions  Concentration: No data recorded Attention Span: No data recorded Recall: No data recorded Fund of Knowledge: No data recorded Language: No data recorded  Psychomotor Activity  Psychomotor Activity: No data recorded  Assets  Assets: No data recorded  Sleep  Sleep: No data recorded  Physical Exam: Physical Exam Vitals and nursing note reviewed.  Constitutional:      Appearance: Normal appearance.  HENT:     Head: Normocephalic and atraumatic.     Mouth/Throat:     Pharynx: Oropharynx is clear.  Eyes:     Pupils: Pupils are equal, round, and reactive to light.  Cardiovascular:     Rate and Rhythm: Normal rate and regular rhythm.  Pulmonary:     Effort: Pulmonary effort is normal.     Breath sounds: Normal breath sounds.  Abdominal:     General: Abdomen is flat.     Palpations: Abdomen is soft.  Musculoskeletal:        General: Normal range of motion.  Skin:    General: Skin is warm and dry.  Neurological:     General: No focal deficit present.     Mental Status: He is alert. Mental status is at baseline.  Psychiatric:        Mood and Affect: Mood normal.        Thought Content: Thought content normal.   Review of Systems  Constitutional: Negative.   HENT: Negative.    Eyes: Negative.   Respiratory: Negative.    Cardiovascular: Negative.   Gastrointestinal: Negative.   Musculoskeletal: Negative.   Skin: Negative.   Neurological: Negative.   Psychiatric/Behavioral:  Negative for depression, hallucinations, memory loss, substance abuse and suicidal ideas. The patient is nervous/anxious and has insomnia.   All other systems reviewed and are negative. Blood  pressure (!) 143/107, pulse 73, temperature 98.8 F (37.1 C), resp. rate 18, height  (1.956 m), weight 97.5 kg, SpO2 100 %. Body mass index is 25.5 kg/m.  Treatment Plan Summary: Plan after completing the interview I feel like the whole complete picture of this patient is not likely to be consistent with a psychotic disorder.  He certainly does not seem like somebody with schizophrenia.  It is possible that he might at some point meet criteria for a manic episode or depression but currently I do not think that is what he is describing.  He seems to have some chronic dysphoria and dissatisfaction with life.  Has some personality tendencies that makes grandiosity of narcissism with some paranoia.  Seems to suffer some conflict between high expectations of himself and some lack of self-esteem as well.  Patient does not appear to be an acute risk of harm to himself or anyone else.  Does not appear to be psychotic from what I can tell.  I do not think he needs inpatient hospitalization.  I advised him that I thought going to see a therapist could be extremely helpful for him in helping him to get better control of his emotions and understanding of himself.  Patient agreed to this.  He will be given referral information to outpatient treatment including [facility] and other programs that are available in the community.  No need for IVC.  No new prescriptions.  Disposition: No evidence of imminent risk to self or others at present.   Patient does not meet criteria for psychiatric inpatient admission. Supportive therapy provided about ongoing stressors. Discussed crisis plan, support from social network, calling 911, coming to the Emergency Department, and calling Suicide Hotline.  Mordecai Rasmussen, MD 08/02/2020 6:22 PM

## 2020-08-02 NOTE — ED Notes (Signed)
Patient belongings: black tennis shoes, 1 silver earring, black cell phone, army shorts, and yellow shirt. Gold chain in cup

## 2020-08-02 NOTE — ED Notes (Signed)
Patient given dinner tray.

## 2020-08-02 NOTE — ED Notes (Signed)
VOL  pending  consult 

## 2020-08-02 NOTE — ED Notes (Addendum)
candun butler took home patient wallet with $24.00 and black hat

## 2020-09-01 ENCOUNTER — Encounter: Payer: Self-pay | Admitting: Family Medicine

## 2020-09-01 ENCOUNTER — Ambulatory Visit: Payer: Medicaid Other | Admitting: Family Medicine

## 2020-09-01 ENCOUNTER — Other Ambulatory Visit: Payer: Self-pay

## 2020-09-01 DIAGNOSIS — Z113 Encounter for screening for infections with a predominantly sexual mode of transmission: Secondary | ICD-10-CM | POA: Diagnosis not present

## 2020-09-01 LAB — HM HIV SCREENING LAB: HM HIV Screening: NEGATIVE

## 2020-09-01 LAB — GRAM STAIN

## 2020-09-01 LAB — HM HEPATITIS C SCREENING LAB: HM Hepatitis Screen: NEGATIVE

## 2020-09-01 NOTE — Progress Notes (Signed)
[facility] Department STI clinic/screening visit  Subjective:  Luis Harper is a 20 y.o. male being seen today for an STI screening visit. The patient reports they do not have symptoms.    Patient has the following medical conditions:   Patient Active Problem List   Diagnosis Date Noted   Adjustment disorder with mixed disturbance of emotions and conduct 08/02/2020   Dysthymia 08/02/2020   HSV (herpes simplex virus) infection 05/12/2019     Chief Complaint  Patient presents with   SEXUALLY TRANSMITTED DISEASE    screening    HPI  Patient reports here for screening, denies s/sx   Does the patient or their partner desires a pregnancy in the next year? No  Screening for MPX risk: Does the patient have an unexplained rash? No Is the patient MSM? No Does the patient endorse multiple sex partners or anonymous sex partners? No Did the patient have close or sexual contact with a person diagnosed with MPX? No Has the patient traveled outside the [place] where MPX is endemic? No Is there a high clinical suspicion for MPX-- evidenced by one of the following No  -Unlikely to be chickenpox  -Lymphadenopathy  -Rash that present in same phase of evolution on any given body part   See flowsheet for further details and programmatic requirements.    The following portions of the patient's history were reviewed and updated as appropriate: allergies, current medications, past medical history, past social history, past surgical history and problem list.  Objective:  There were no vitals filed for this visit.  Physical Exam Constitutional:      Appearance: Normal appearance.  HENT:     Head: Normocephalic.     Mouth/Throat:     Mouth: Mucous membranes are moist.     Pharynx: Oropharynx is clear. No oropharyngeal exudate.  Pulmonary:     Effort: Pulmonary effort is normal.  Genitourinary:    Penis: Normal.      Testes: Normal.     Comments: No lice, nits, or pest, no  lesions or odor discharge.  Denies pain or tenderness with paplation of testicles.  No lesions, ulcers or masses present.    Musculoskeletal:     Cervical back: Normal range of motion and neck supple.  Lymphadenopathy:     Cervical: No cervical adenopathy.  Skin:    General: Skin is warm and dry.     Findings: No bruising, erythema, lesion or rash.  Neurological:     Mental Status: He is alert and oriented to person, place, and time.  Psychiatric:        Mood and Affect: Mood normal.        Behavior: Behavior normal.      Assessment and Plan:  Luis Harper is a 20 y.o. male presenting to the [facility] Department for STI screening  1. Screening examination for venereal disease  - Gonococcus culture - HBV Antigen/Antibody [facility] - HIV/HCV [facility] - Syphilis Serology, [place] [facility] - Gram stain - Gonococcus culture  Patient does not have STI symptoms Patient accepted all screenings including  gram stain, oral, urethral GC and bloodwork for HIV/RPR.  Patient meets criteria for HepB screening? Yes. Ordered? Yes Patient meets criteria for HepC screening? Yes. Ordered? Yes Recommended condom use with all sex Discussed importance of condom use for STI prevent  Treat gram stain per standing order Discussed time line for State Lab results and that patient will be called with positive results  and encouraged patient to call if he had not heard in 2 weeks Recommended returning for continued or worsening symptoms.    No follow-ups on file.  No future appointments.  Wendi Snipes, FNP

## 2020-09-01 NOTE — Progress Notes (Signed)
Pt here for STD screening.  Gram stain results reviewed, no treatment required.  Pt given condoms. Alima Naser M Raynor Calcaterra, RN ° °

## 2020-09-06 LAB — GONOCOCCUS CULTURE

## 2020-09-08 ENCOUNTER — Emergency Department
Admission: EM | Admit: 2020-09-08 | Discharge: 2020-09-08 | Disposition: A | Discharge status: Home / Self Care | Payer: Medicaid Other | Attending: Emergency Medicine | Admitting: Emergency Medicine

## 2020-09-08 ENCOUNTER — Emergency Department: Payer: Medicaid Other

## 2020-09-08 ENCOUNTER — Other Ambulatory Visit: Payer: Self-pay

## 2020-09-08 ENCOUNTER — Encounter: Payer: Self-pay | Admitting: Emergency Medicine

## 2020-09-08 DIAGNOSIS — R0602 Shortness of breath: Secondary | ICD-10-CM | POA: Diagnosis not present

## 2020-09-08 DIAGNOSIS — F17200 Nicotine dependence, unspecified, uncomplicated: Secondary | ICD-10-CM | POA: Insufficient documentation

## 2020-09-08 DIAGNOSIS — Z77120 Contact with and (suspected) exposure to mold (toxic): Secondary | ICD-10-CM | POA: Diagnosis not present

## 2020-09-08 MED ORDER — ALBUTEROL SULFATE HFA 108 (90 BASE) MCG/ACT IN AERS: 2.0000 | INHALATION_SPRAY | Freq: Four times a day (QID) | RESPIRATORY_TRACT | 2 refills | Status: AC | PRN

## 2020-09-08 NOTE — Discharge Instructions (Addendum)
See attached information about mold Use the inhaler as needed Return if worsening

## 2020-09-08 NOTE — ED Triage Notes (Signed)
Pt comes into the ED via POV c/o [facility] and concerns for black mold in his apartment.  PT states he found some and now has had SHOB and cough.  Pt present with even and unlabored respirations.

## 2020-09-08 NOTE — ED Notes (Signed)
See triage note  Presents with some SOB  States he noticed sxs' about 1 month ago  No fever or cough  But states his apartment has mold  resp even and non labored

## 2020-09-08 NOTE — ED Provider Notes (Signed)
[facility] Emergency Department Provider Note  ____________________________________________   Event Date/Time   First MD Initiated Contact with Patient 09/08/20 1444     (approximate)  I have reviewed the triage vital signs and the nursing notes.   HISTORY  Chief Complaint Shortness of Breath    HPI Luis Harper is a 20 y.o. male presents emergency department complaining of mold exposure in his apartment.  States it is on the walls, and the air vents, in the bathroom, states there is a lot of water damage in the apartment.  States he has begun to have some shortness of breath when he is in the environment and does not have the stamina while running that he used to have.  Denies fever or chills.  Does have some fogginess at time and trying to focus.  History reviewed. No pertinent past medical history.  Patient Active Problem List   Diagnosis Date Noted   Adjustment disorder with mixed disturbance of emotions and conduct 08/02/2020   Dysthymia 08/02/2020   HSV (herpes simplex virus) infection 05/12/2019    History reviewed. No pertinent surgical history.  Prior to Admission medications   Medication Sig Start Date End Date Taking? Authorizing Provider  albuterol (VENTOLIN HFA) 108 (90 Base) MCG/ACT inhaler Inhale 2 puffs into the lungs every 6 (six) hours as needed for wheezing or shortness of breath. 09/08/20  Yes Faythe Ghee, PA-C    Allergies Peanut-containing drug products  History reviewed. No pertinent family history.  Social History Social History   Tobacco Use   Smoking status: Never   Smokeless tobacco: Never  Vaping Use   Vaping Use: Some days   Substances: Nicotine, Flavoring  Substance Use Topics   Alcohol use: Yes    Alcohol/week: 1.0 standard drink    Types: 1 Glasses of wine per week    Comment: occ   Drug use: Not Currently    Types: Marijuana    Comment: last used 5 weeks  ago    Review of  Systems  Constitutional: No fever/chills Eyes: No visual changes. ENT: No sore throat. Respiratory: Denies cough Cardiovascular: Denies chest pain Gastrointestinal: Denies abdominal pain Genitourinary: Negative for dysuria. Musculoskeletal: Negative for back pain. Skin: Negative for rash. Psychiatric: no mood changes,     ____________________________________________   PHYSICAL EXAM:  VITAL SIGNS: ED Triage Vitals  Enc Vitals Group     BP 09/08/20 1406 (!) 146/91     Pulse Rate 09/08/20 1406 85     Resp 09/08/20 1406 17     Temp 09/08/20 1406 98.4 F (36.9 C)     Temp Source 09/08/20 1406 Oral     SpO2 09/08/20 1406 99 %     Weight 09/08/20 1407 214 lb 15.2 oz (97.5 kg)     Height 09/08/20 1407 6\' 5"  (1.956 m)     Head Circumference --      Peak Flow --      Pain Score 09/08/20 1407 0     Pain Loc --      Pain Edu? --      Excl. in GC? --     Constitutional: Alert and oriented. Well appearing and in no acute distress. Eyes: Conjunctivae are normal.  Head: Atraumatic. Nose: No congestion/rhinnorhea. Mouth/Throat: Mucous membranes are moist.   Neck:  supple no lymphadenopathy noted Cardiovascular: Normal rate, regular rhythm. Heart sounds are normal Respiratory: Normal respiratory effort.  No retractions, lungs c t a  GU: deferred Musculoskeletal: FROM  all extremities, warm and well perfused Neurologic:  Normal speech and language.  Skin:  Skin is warm, dry and intact. No rash noted. Psychiatric: Mood and affect are normal. Speech and behavior are normal.  ____________________________________________   LABS (all labs ordered are listed, but only abnormal results are displayed)  Labs Reviewed - No data to display ____________________________________________   ____________________________________________  RADIOLOGY  Chest x-ray  ____________________________________________   PROCEDURES  Procedure(s) performed:  No  Procedures    ____________________________________________   INITIAL IMPRESSION / ASSESSMENT AND PLAN / ED COURSE  Pertinent labs & imaging results that were available during my care of the patient were reviewed by me and considered in my medical decision making (see chart for details).   Patient is 20 year old male presents emergency department with mold exposure.  See HPI.  Physical exam shows patient per stable  Chest x-ray reviewed by me confirmed by radiology to be negative for any acute abnormality  The patient was given an inhaler to help while having shortness of breath in the environment.  Explained to him that usually bleach products will kill the mold but that he should have the area evaluated.  He was given CDC information about mold and mold exposure.  He is to return emergency department worsening.  He is discharged stable condition.     Luis Caba was evaluated in Emergency Department on 09/08/2020 for the symptoms described in the history of present illness. He was evaluated in the context of the global COVID-19 pandemic, which necessitated consideration that the patient might be at risk for infection with the SARS-CoV-2 virus that causes COVID-19. Institutional protocols and algorithms that pertain to the evaluation of patients at risk for COVID-19 are in a state of rapid change based on information released by regulatory bodies including the CDC and federal and state organizations. These policies and algorithms were followed during the patient's care in the ED.    As part of my medical decision making, I reviewed the following data within the electronic MEDICAL RECORD NUMBER Nursing notes reviewed and incorporated, Old chart reviewed, Radiograph reviewed , Notes from prior ED visits, and [place] Controlled Substance Database  ____________________________________________   FINAL CLINICAL IMPRESSION(S) / ED DIAGNOSES  Final diagnoses:  SOB (shortness of breath)  Contact with  or exposure to mold      NEW MEDICATIONS STARTED DURING THIS VISIT:  New Prescriptions   ALBUTEROL (VENTOLIN HFA) 108 (90 BASE) MCG/ACT INHALER    Inhale 2 puffs into the lungs every 6 (six) hours as needed for wheezing or shortness of breath.     Note:  This document was prepared using Dragon voice recognition software and may include unintentional dictation errors.    Faythe Ghee, PA-C 09/08/20 1517    Minna Antis, MD 09/09/20 2238

## 2020-09-16 ENCOUNTER — Other Ambulatory Visit: Payer: Self-pay

## 2020-09-16 ENCOUNTER — Emergency Department
Admission: EM | Admit: 2020-09-16 | Discharge: 2020-09-16 | Disposition: A | Discharge status: Home / Self Care | Payer: Medicaid Other | Attending: Emergency Medicine | Admitting: Emergency Medicine

## 2020-09-16 ENCOUNTER — Encounter: Payer: Self-pay | Admitting: Emergency Medicine

## 2020-09-16 DIAGNOSIS — Z760 Encounter for issue of repeat prescription: Secondary | ICD-10-CM | POA: Diagnosis not present

## 2020-09-16 DIAGNOSIS — Z91010 Allergy to peanuts: Secondary | ICD-10-CM | POA: Insufficient documentation

## 2020-09-16 HISTORY — DX: Herpesviral infection, unspecified: B00.9

## 2020-09-16 MED ORDER — VALACYCLOVIR HCL 1 G PO TABS: 2000.0000 mg | ORAL_TABLET | Freq: Three times a day (TID) | ORAL | 0 refills | Status: AC

## 2020-09-16 NOTE — Discharge Instructions (Signed)
Take Valtrex three times daily for the next seven days.  

## 2020-09-16 NOTE — ED Triage Notes (Signed)
Pt reports that he has a herpes outbreak and needs a refill on his medication.

## 2020-09-16 NOTE — ED Provider Notes (Signed)
[facility]-EMERGENCY DEPARTMENT  ____________________________________________  Time seen: Approximately 6:49 PM  I have reviewed the triage vital signs and the nursing notes.   HISTORY  Chief Complaint Medication Refill   Historian Patient     HPI Luis Harper is a 20 y.o. male presents to the emergency department for a prescription of Valtrex given recent outbreak of genital herpes.  Patient states that this is his third outbreak which usually improves with Valtrex.  He has no other medical complaints.   Past Medical History:  Diagnosis Date   Herpes      Immunizations up to date:  Yes.     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  Prior to Admission medications   Medication Sig Start Date End Date Taking? Authorizing Provider  valACYclovir (VALTREX) 1000 MG tablet Take 2 tablets (2,000 mg total) by mouth 3 (three) times daily for 7 days. 09/16/20 09/23/20 Yes Pia Mau M, PA-C  albuterol (VENTOLIN HFA) 108 (90 Base) MCG/ACT inhaler Inhale 2 puffs into the lungs every 6 (six) hours as needed for wheezing or shortness of breath. 09/08/20   Faythe Ghee, PA-C    Allergies Peanut-containing drug products  No family history on file.  Social History Social History   Tobacco Use   Smoking status: Never   Smokeless tobacco: Never  Vaping Use   Vaping Use: Some days   Substances: Nicotine, Flavoring  Substance Use Topics   Alcohol use: Yes    Alcohol/week: 1.0 standard drink    Types: 1 Glasses of wine per week    Comment: occ   Drug use: Not Currently    Types: Marijuana    Comment: last used 5 weeks  ago     Review of Systems  Constitutional: No fever/chills Eyes:  No discharge ENT: No upper  respiratory complaints. Respiratory: no cough. No SOB/ use of accessory muscles to breath Gastrointestinal:   No nausea, no vomiting.  No diarrhea.  No constipation. Musculoskeletal: Negative for musculoskeletal pain. Skin: Patient has rash.     ____________________________________________   PHYSICAL EXAM:  VITAL SIGNS: ED Triage Vitals [09/16/20 1456]  Enc Vitals Group     BP (!) 139/92     Pulse Rate 87     Resp 18     Temp 98.6 F (37 C)     Temp Source Oral     SpO2      Weight 214 lb 15.2 oz (97.5 kg)     Height 6\' 5"  (1.956 m)     Head Circumference      Peak Flow      Pain Score 0     Pain Loc      Pain Edu?      Excl. in GC?      Constitutional: Alert and oriented. Well appearing and in no acute distress. Eyes: Conjunctivae are normal. PERRL. EOMI. Head: Atraumatic. ENT:      Nose: No congestion/rhinnorhea.      Mouth/Throat: Mucous membranes are moist.  Neck: No stridor.  No cervical spine tenderness to palpation. Cardiovascular: Normal rate, regular rhythm. Normal S1 and S2.  Good peripheral circulation. Respiratory: Normal respiratory effort without tachypnea or retractions. Lungs CTAB. Good air entry to  the bases with no decreased or absent breath sounds Gastrointestinal: Bowel sounds x 4 quadrants. Soft and nontender to palpation. No guarding or rigidity. No distention. Musculoskeletal: Full range of motion to all extremities. No obvious deformities noted Neurologic:  Normal for age. No gross focal neurologic deficits are appreciated.  Skin: Patient has rash consistent with genital herpes. Psychiatric: Mood and affect are normal for age. Speech and behavior are normal.   ____________________________________________   LABS (all labs ordered are listed, but only abnormal results are displayed)  Labs Reviewed - No data to display ____________________________________________  EKG   ____________________________________________  RADIOLOGY   No  results found.  ____________________________________________    PROCEDURES  Procedure(s) performed:     Procedures     Medications - No data to display   ____________________________________________   INITIAL IMPRESSION / ASSESSMENT AND PLAN / ED COURSE  Pertinent labs & imaging results that were available during my care of the patient were reviewed by me and considered in my medical decision making (see chart for details).      Assessment and plan  Medication refill 20 year old male presents to the emergency department for refill for Valtrex for recent herpes outbreak.  Valtrex was prescribed as requested.  Return precautions were given to return with new or worsening symptoms.     ____________________________________________  FINAL CLINICAL IMPRESSION(S) / ED DIAGNOSES  Final diagnoses:  Medication refill      NEW MEDICATIONS STARTED DURING THIS VISIT:  ED Discharge Orders          Ordered    valACYclovir (VALTREX) 1000 MG tablet  3 times daily        09/16/20 1620                This chart was dictated using voice recognition software/Dragon. Despite best efforts to proofread, errors can occur which can change the meaning. Any change was purely unintentional.     Orvil Feil, PA-C 09/16/20 1851    Gilles Chiquito, MD 09/16/20 Mikle Bosworth

## 2020-10-06 ENCOUNTER — Ambulatory Visit: Payer: Self-pay | Admitting: Internal Medicine

## 2021-02-03 DIAGNOSIS — U071 COVID-19: Secondary | ICD-10-CM | POA: Diagnosis not present

## 2022-02-08 IMAGING — CR DG CHEST 2V
1 series · 2 of 2 positions shown · non-contrast
Comparison: Chest radiograph 07/04/2018

CLINICAL DATA: Shortness of breath

EXAM:
CHEST - 2 VIEW

[Series 1: dg chest 2 view · 0.14mm/px · 2 of 2 slices shown]
[im 1/2]
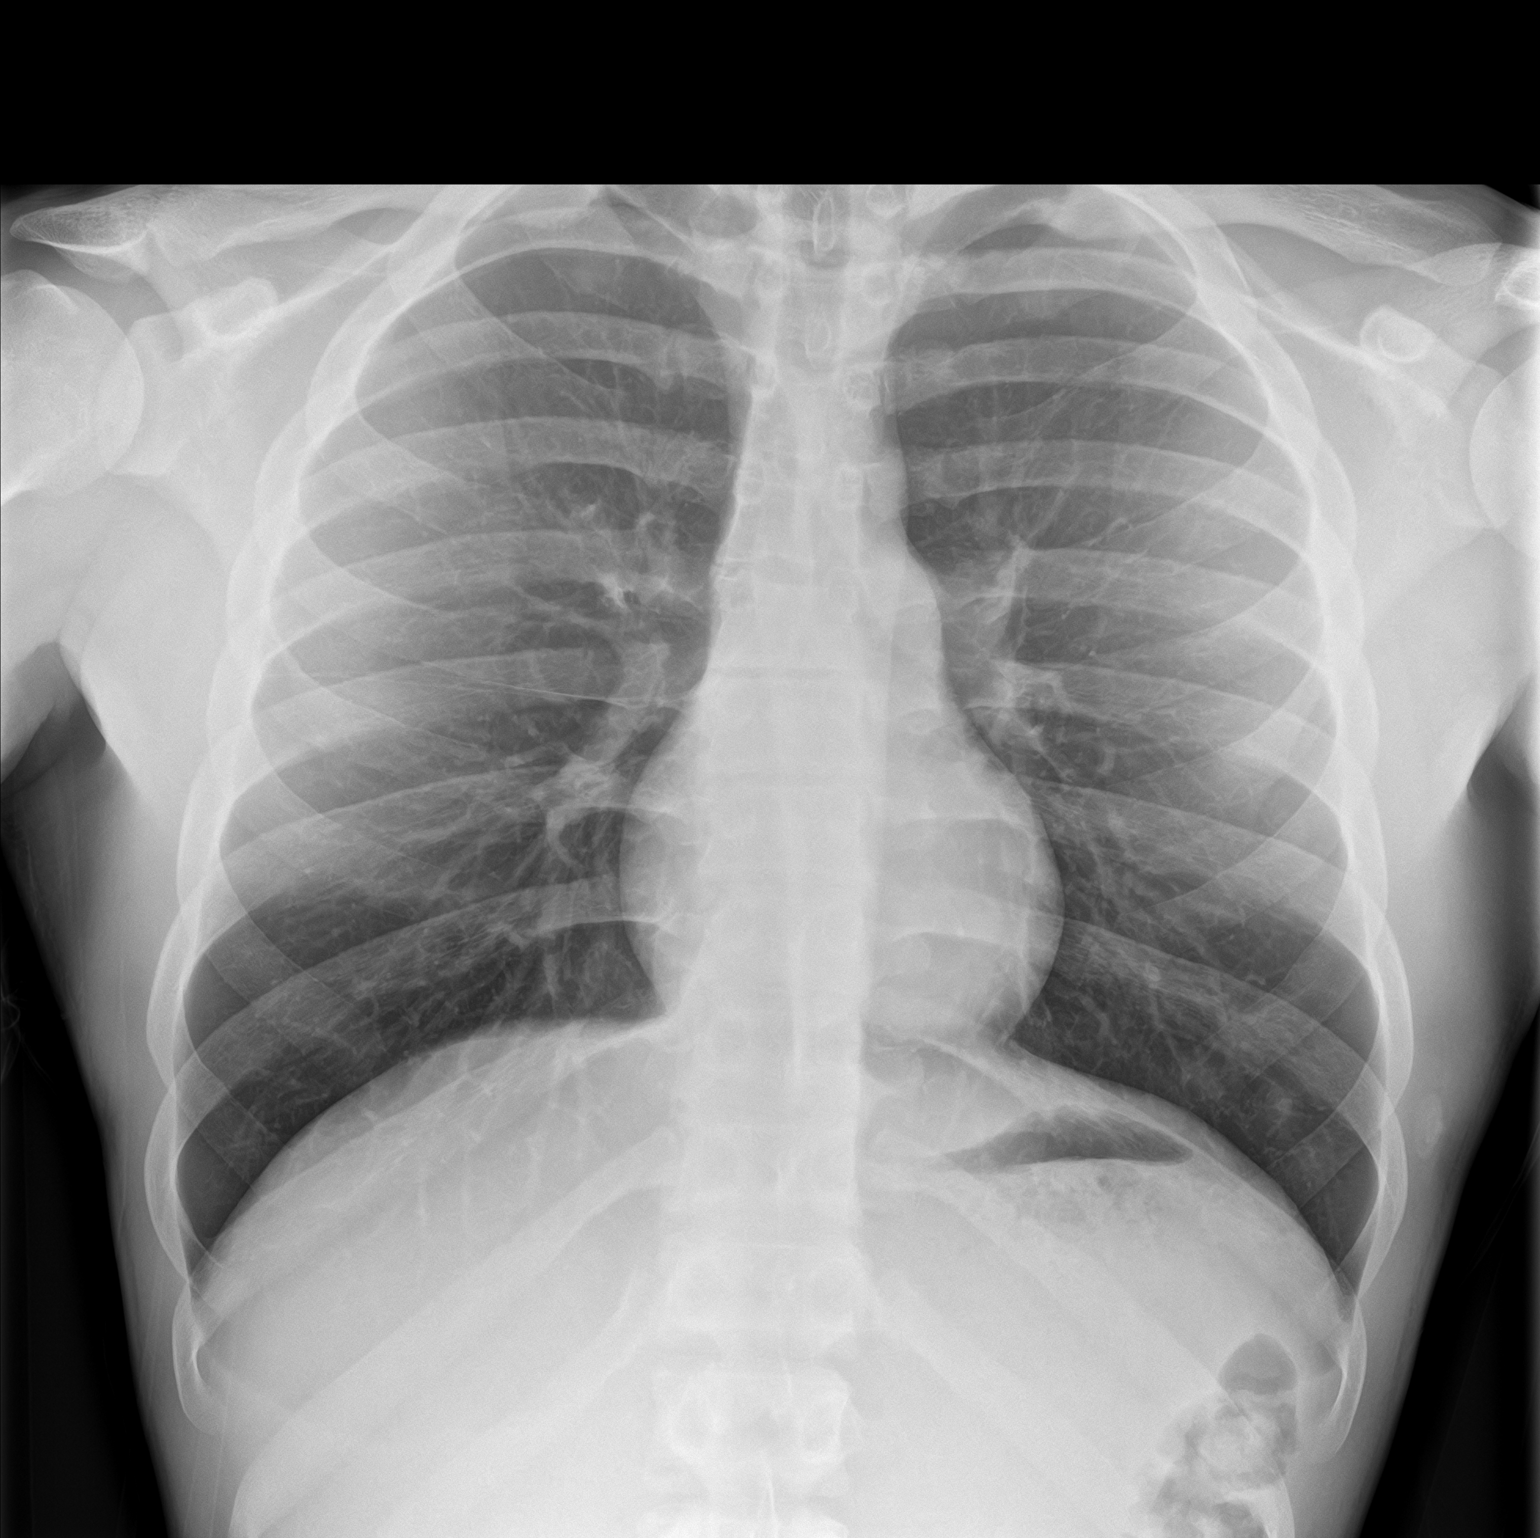
[im 2/2]
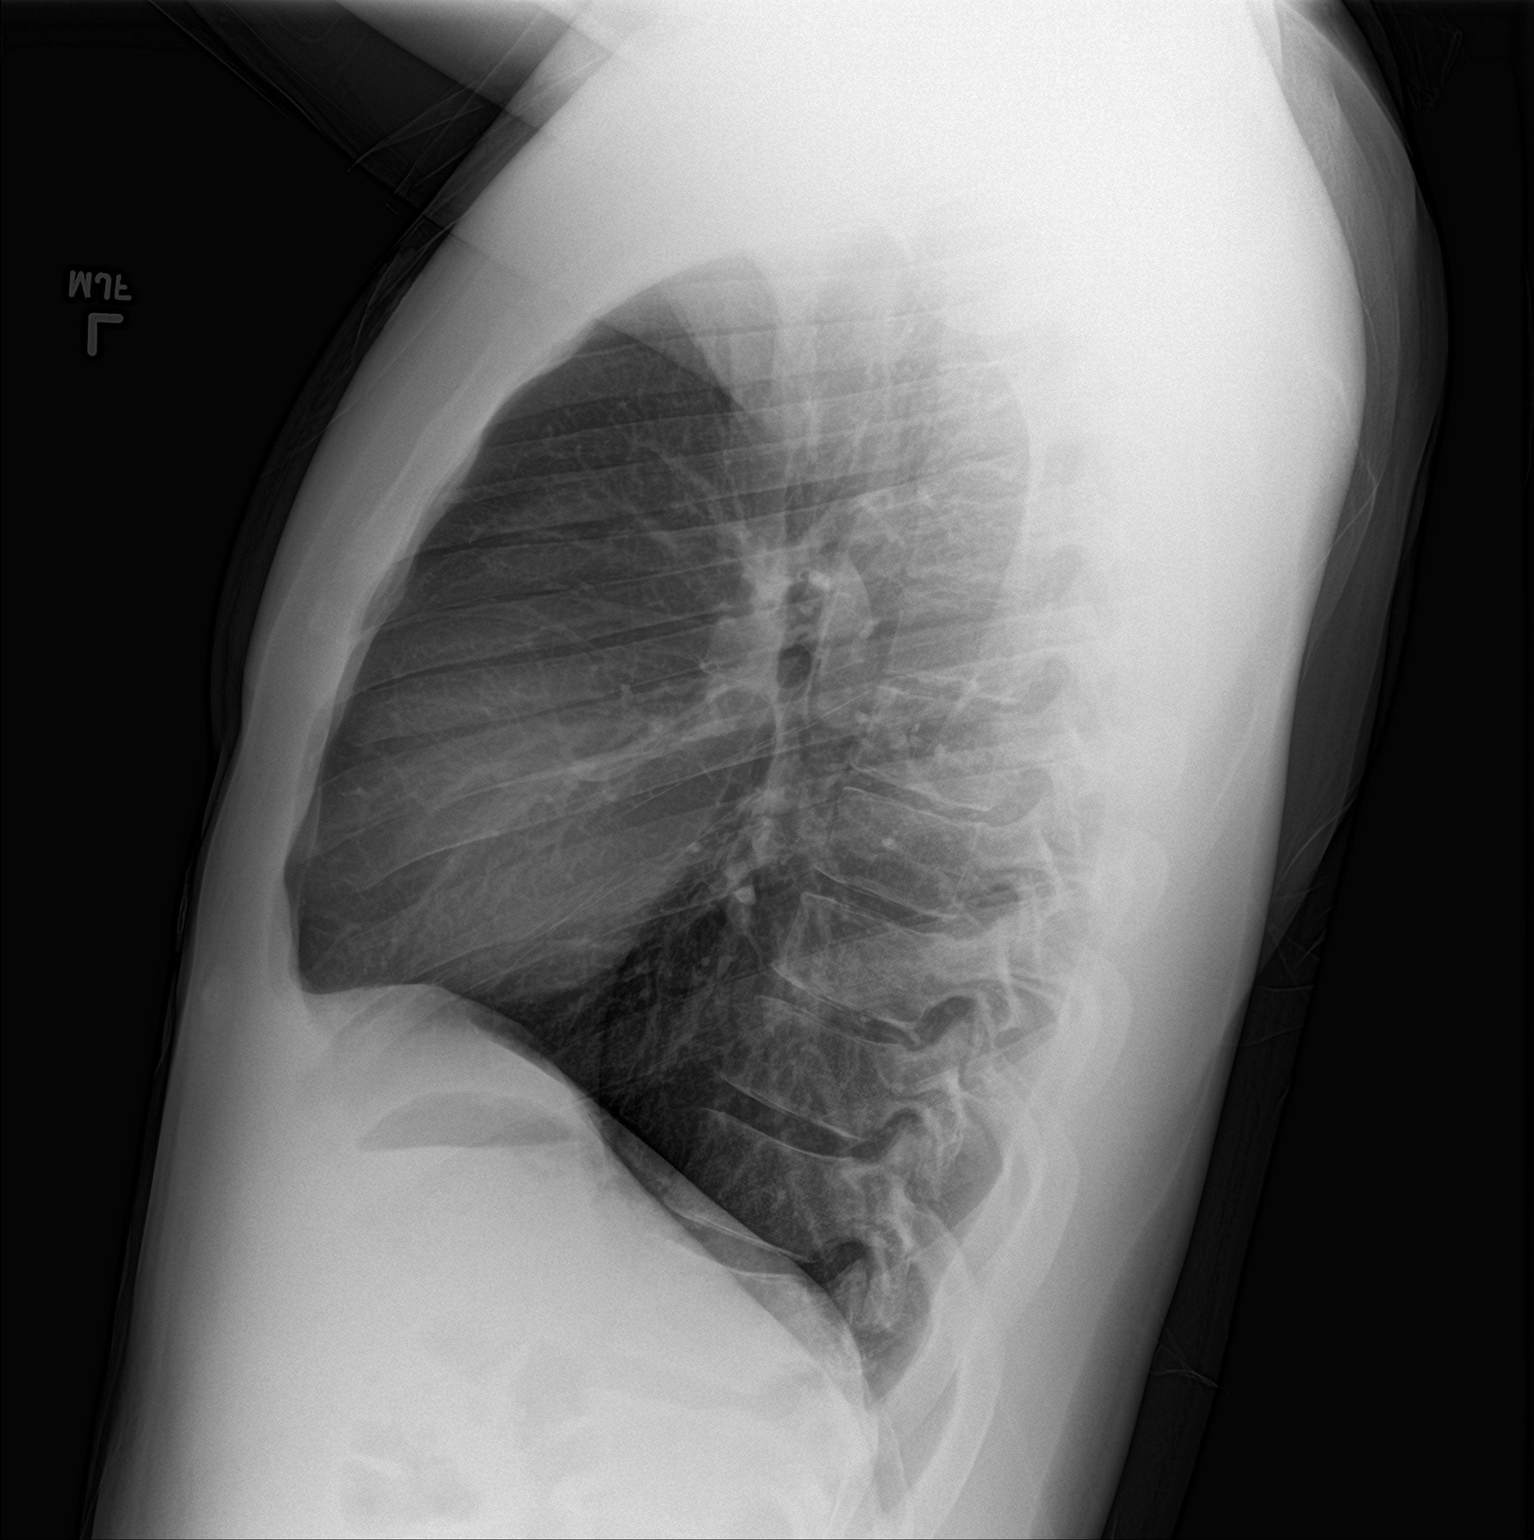

[2 of 2 positions shown; findings below may reference images not displayed]

FINDINGS: The heart size and mediastinal contours are within normal limits.No
focal airspace disease. No pleural effusion or pneumothorax.No acute
osseous abnormality.
IMPRESSION: No evidence of acute cardiopulmonary disease.

## 2022-03-02 DIAGNOSIS — Z87891 Personal history of nicotine dependence: Secondary | ICD-10-CM | POA: Diagnosis not present

## 2022-03-02 DIAGNOSIS — R69 Illness, unspecified: Secondary | ICD-10-CM | POA: Diagnosis not present

## 2022-03-02 DIAGNOSIS — Z8249 Family history of ischemic heart disease and other diseases of the circulatory system: Secondary | ICD-10-CM | POA: Diagnosis not present

## 2022-03-02 DIAGNOSIS — Z823 Family history of stroke: Secondary | ICD-10-CM | POA: Diagnosis not present

## 2022-05-08 DIAGNOSIS — Z131 Encounter for screening for diabetes mellitus: Secondary | ICD-10-CM | POA: Diagnosis not present

## 2022-05-08 DIAGNOSIS — J4520 Mild intermittent asthma, uncomplicated: Secondary | ICD-10-CM | POA: Diagnosis not present

## 2022-05-08 DIAGNOSIS — A6000 Herpesviral infection of urogenital system, unspecified: Secondary | ICD-10-CM | POA: Diagnosis not present

## 2022-05-08 DIAGNOSIS — Z0000 Encounter for general adult medical examination without abnormal findings: Secondary | ICD-10-CM | POA: Diagnosis not present

## 2022-05-08 DIAGNOSIS — Z114 Encounter for screening for human immunodeficiency virus [HIV]: Secondary | ICD-10-CM | POA: Diagnosis not present

## 2022-05-08 DIAGNOSIS — Z13220 Encounter for screening for lipoid disorders: Secondary | ICD-10-CM | POA: Diagnosis not present

## 2022-05-08 DIAGNOSIS — Z113 Encounter for screening for infections with a predominantly sexual mode of transmission: Secondary | ICD-10-CM | POA: Diagnosis not present

## 2022-05-08 DIAGNOSIS — F321 Major depressive disorder, single episode, moderate: Secondary | ICD-10-CM | POA: Diagnosis not present

## 2022-05-08 DIAGNOSIS — F411 Generalized anxiety disorder: Secondary | ICD-10-CM | POA: Diagnosis not present

## 2022-05-08 DIAGNOSIS — Z130 Encounter for screening for diseases of the blood and blood-forming organs and certain disorders involving the immune mechanism: Secondary | ICD-10-CM | POA: Diagnosis not present

## 2023-07-25 ENCOUNTER — Encounter: Payer: Self-pay | Admitting: *Deleted

## 2023-07-25 ENCOUNTER — Encounter: Payer: Self-pay | Admitting: Nurse Practitioner

## 2023-07-25 ENCOUNTER — Ambulatory Visit (INDEPENDENT_AMBULATORY_CARE_PROVIDER_SITE_OTHER): Payer: MEDICAID | Admitting: Nurse Practitioner

## 2023-07-25 VITALS — BP 128/80 | HR 64 | Temp 98.1°F | Ht 75.0 in | Wt 215.6 lb

## 2023-07-25 DIAGNOSIS — F22 Delusional disorders: Secondary | ICD-10-CM

## 2023-07-25 DIAGNOSIS — F322 Major depressive disorder, single episode, severe without psychotic features: Secondary | ICD-10-CM | POA: Diagnosis not present

## 2023-07-25 DIAGNOSIS — G4701 Insomnia due to medical condition: Secondary | ICD-10-CM | POA: Diagnosis not present

## 2023-07-25 LAB — VITAMIN D 25 HYDROXY (VIT D DEFICIENCY, FRACTURES): VITD: 33.46 ng/mL (ref 30.00–100.00)

## 2023-07-25 LAB — CBC
HCT: 41 % (ref 39.0–52.0)
Hemoglobin: 13.4 g/dL (ref 13.0–17.0)
MCHC: 32.7 g/dL (ref 30.0–36.0)
MCV: 88 fl (ref 78.0–100.0)
Platelets: 202 K/uL (ref 150.0–400.0)
RBC: 4.66 Mil/uL (ref 4.22–5.81)
RDW: 12.8 % (ref 11.5–15.5)
WBC: 4 K/uL (ref 4.0–10.5)

## 2023-07-25 LAB — COMPREHENSIVE METABOLIC PANEL WITH GFR
ALT: 20 U/L (ref 0–53)
AST: 23 U/L (ref 0–37)
Albumin: 4.5 g/dL (ref 3.5–5.2)
Alkaline Phosphatase: 32 U/L — ABNORMAL LOW (ref 39–117)
BUN: 16 mg/dL (ref 6–23)
CO2: 29 meq/L (ref 19–32)
Calcium: 9.4 mg/dL (ref 8.4–10.5)
Chloride: 102 meq/L (ref 96–112)
Creatinine, Ser: 1.1 mg/dL (ref 0.40–1.50)
GFR: 93.6 mL/min (ref >60.00)
Glucose, Bld: 82 mg/dL (ref 70–99)
Potassium: 4 meq/L (ref 3.5–5.1)
Sodium: 138 meq/L (ref 135–145)
Total Bilirubin: 0.7 mg/dL (ref 0.2–1.2)
Total Protein: 6.9 g/dL (ref 6.0–8.3)

## 2023-07-25 LAB — VITAMIN B12: Vitamin B-12: 588 pg/mL (ref 211–911)

## 2023-07-25 LAB — TSH: TSH: 0.55 u[IU]/mL (ref 0.35–5.50)

## 2023-07-25 MED ORDER — TRAZODONE HCL 50 MG PO TABS: 25.0000 mg | ORAL_TABLET | Freq: Every evening | ORAL | 0 refills | Status: DC | PRN

## 2023-07-25 MED ORDER — FLUOXETINE HCL 20 MG PO CAPS: 20.0000 mg | ORAL_CAPSULE | Freq: Every day | ORAL | 0 refills | Status: AC

## 2023-07-25 NOTE — Patient Instructions (Addendum)
 Nice to see you today I have sent medications to the pharmacy  I have made an urgent referral to psychiatry and therapy   [facility] in [place] offer walk in service Monday, Wednesday, Friday from 8-3  [address] [place], [place] [address] Phone: [phone] Fax: [phone]  [facility] urgent care fro 24 hour walk in care [address]  [phone]

## 2023-07-25 NOTE — Assessment & Plan Note (Signed)
 Will try trazodone  25-50mg  qhs

## 2023-07-25 NOTE — Assessment & Plan Note (Signed)
 No hallcuinations or HI/SI. He did contract to safety with resources given. Start prozac  20 mg daily and trazodone . Referral to psych and psychology
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
## 2023-07-25 NOTE — Assessment & Plan Note (Signed)
 History of the same without SI/HI. Will start on prozac  20mg  daily and trazodone . Urgent referral to pysch and psychology

## 2023-07-26 ENCOUNTER — Telehealth: Payer: Self-pay

## 2023-07-26 DIAGNOSIS — F22 Delusional disorders: Secondary | ICD-10-CM

## 2023-07-26 DIAGNOSIS — F322 Major depressive disorder, single episode, severe without psychotic features: Secondary | ICD-10-CM

## 2023-07-26 NOTE — Telephone Encounter (Signed)
 Copied from CRM [id number]. Topic: Referral - Status >> Jul 26, 2023  9:07 AM Burnard DEL wrote: Reason for CRM: Dr Chipper office called stating that they are not in network with patients insurance ,so they aren't able to see him.

## 2023-07-26 NOTE — Addendum Note (Signed)
 Addended by: WENDEE LYNWOOD HERO on: 07/26/2023 04:52 PM   Modules accepted: Orders

## 2023-07-30 ENCOUNTER — Ambulatory Visit: Payer: Self-pay | Admitting: Nurse Practitioner

## 2023-07-31 NOTE — Addendum Note (Signed)
 Addended by: WENDEE LYNWOOD HERO on: 07/31/2023 04:45 PM   Modules accepted: Orders

## 2023-08-12 ENCOUNTER — Telehealth: Payer: Self-pay

## 2023-08-12 NOTE — Telephone Encounter (Signed)
 Copied from CRM [id number]. Topic: Referral - Question >> Aug 12, 2023 10:53 AM Berneda FALCON wrote: Reason for CRM: Patient wanted information for Dr. Chipper who the psych referral was sent to, but when I called them to help him get scheduled, I was informed they were not in network with the patient's insurance.   Can we please recommend a new psyche for this patient that is in network?  Patient callback is [phone]

## 2023-08-12 NOTE — Telephone Encounter (Signed)
 I got that notification and did change the referral   The following information is what I see. He can call and schedule at   [facility] [address] [place] [address] [phone]

## 2023-08-21 ENCOUNTER — Other Ambulatory Visit: Payer: Self-pay | Admitting: Nurse Practitioner

## 2023-08-21 DIAGNOSIS — F22 Delusional disorders: Secondary | ICD-10-CM

## 2023-08-21 DIAGNOSIS — G4701 Insomnia due to medical condition: Secondary | ICD-10-CM

## 2023-08-21 DIAGNOSIS — F322 Major depressive disorder, single episode, severe without psychotic features: Secondary | ICD-10-CM

## 2023-10-20 ENCOUNTER — Other Ambulatory Visit: Payer: Self-pay | Admitting: Nurse Practitioner

## 2023-10-20 DIAGNOSIS — F322 Major depressive disorder, single episode, severe without psychotic features: Secondary | ICD-10-CM

## 2023-10-20 DIAGNOSIS — F22 Delusional disorders: Secondary | ICD-10-CM

## 2023-10-21 NOTE — Telephone Encounter (Signed)
 Patient did not follow up as requested. Has he gotten in with psychiatry?

## 2023-10-23 ENCOUNTER — Telehealth: Payer: Self-pay

## 2023-10-23 DIAGNOSIS — F22 Delusional disorders: Secondary | ICD-10-CM

## 2023-10-23 DIAGNOSIS — G4701 Insomnia due to medical condition: Secondary | ICD-10-CM

## 2023-10-23 DIAGNOSIS — F322 Major depressive disorder, single episode, severe without psychotic features: Secondary | ICD-10-CM

## 2023-10-23 NOTE — Telephone Encounter (Signed)
 Copied from CRM [id number]. Topic: Clinical - Medical Advice >> Oct 23, 2023  3:02 PM [name] wrote: Reason for CRM: Returning Luis Harper [place], [place] phone call

## 2023-10-23 NOTE — Telephone Encounter (Signed)
 Left message to return call to our office.

## 2023-10-23 NOTE — Telephone Encounter (Signed)
 Pt has not made appointment with psychiatry. States they sent messages to the wrong email. Pt states their office said he no called no showed but complains of an issue with email. Pt would like a new referral for psychiatry or to see if they would still take him on.

## 2023-10-23 NOTE — Telephone Encounter (Signed)
 Can we call the office and see if they will accept him? If not he will need a new referral to a new office

## 2023-10-24 NOTE — Telephone Encounter (Signed)
 Unable to reach Psychiatry office. Will try again later.

## 2023-10-28 NOTE — Telephone Encounter (Signed)
 Contacted [facility] in [place]. No answer, left a VM for office assistant to call back.

## 2023-11-01 NOTE — Addendum Note (Signed)
 Addended by: WENDEE LYNWOOD HERO on: 11/01/2023 04:41 PM   Modules accepted: Orders

## 2024-02-10 ENCOUNTER — Ambulatory Visit (HOSPITAL_COMMUNITY): Payer: MEDICAID | Admitting: Registered Nurse
